# Patient Record
Sex: Male | Born: 1980 | Race: White | Hispanic: No | Marital: Single | Smoking: Never smoker
Health system: Southern US, Community
[De-identification: ages and names within clinical notes are randomized; demographics above are authoritative.]

## PROBLEM LIST (undated history)

## (undated) HISTORY — PX: OTHER SURGICAL HISTORY: SHX169

## (undated) HISTORY — PX: WISDOM TOOTH EXTRACTION: SHX21

---

## 2015-09-20 ENCOUNTER — Ambulatory Visit (HOSPITAL_COMMUNITY)
Admission: EM | Admit: 2015-09-20 | Discharge: 2015-09-20 | Disposition: A | Discharge status: Home / Self Care | Payer: PRIVATE HEALTH INSURANCE | Attending: Family Medicine | Admitting: Family Medicine

## 2015-09-20 ENCOUNTER — Encounter (HOSPITAL_COMMUNITY): Payer: Self-pay | Admitting: Family Medicine

## 2015-09-20 DIAGNOSIS — M25511 Pain in right shoulder: Secondary | ICD-10-CM

## 2015-09-20 DIAGNOSIS — M792 Neuralgia and neuritis, unspecified: Secondary | ICD-10-CM

## 2015-09-20 MED ORDER — GABAPENTIN 300 MG PO CAPS: ORAL_CAPSULE | ORAL | Status: DC

## 2015-09-20 MED ORDER — PREDNISONE 20 MG PO TABS: ORAL_TABLET | ORAL | Status: DC

## 2015-09-20 MED ORDER — DEXAMETHASONE SODIUM PHOSPHATE 10 MG/ML IJ SOLN
INTRAMUSCULAR | Status: AC
  Filled 2015-09-20: qty 1

## 2015-09-20 MED ORDER — HYDROCODONE-ACETAMINOPHEN 5-325 MG PO TABS
1.0000 | ORAL_TABLET | Freq: Once | ORAL | Status: AC
  Administered 2015-09-20: 1 via ORAL

## 2015-09-20 MED ORDER — HYDROCODONE-ACETAMINOPHEN 5-325 MG PO TABS
ORAL_TABLET | ORAL | Status: AC
  Filled 2015-09-20: qty 1

## 2015-09-20 MED ORDER — CYCLOBENZAPRINE HCL 5 MG PO TABS: 5.0000 mg | ORAL_TABLET | Freq: Three times a day (TID) | ORAL | Status: DC | PRN

## 2015-09-20 MED ORDER — TRAMADOL HCL 50 MG PO TABS: 50.0000 mg | ORAL_TABLET | Freq: Four times a day (QID) | ORAL | Status: DC | PRN

## 2015-09-20 MED ORDER — KETOROLAC TROMETHAMINE 60 MG/2ML IM SOLN
60.0000 mg | Freq: Once | INTRAMUSCULAR | Status: AC
  Administered 2015-09-20: 60 mg via INTRAMUSCULAR

## 2015-09-20 MED ORDER — DEXAMETHASONE SODIUM PHOSPHATE 10 MG/ML IJ SOLN
10.0000 mg | Freq: Once | INTRAMUSCULAR | Status: AC
  Administered 2015-09-20: 10 mg via INTRAMUSCULAR

## 2015-09-20 MED ORDER — KETOROLAC TROMETHAMINE 60 MG/2ML IM SOLN
INTRAMUSCULAR | Status: AC
  Filled 2015-09-20: qty 2

## 2015-09-20 NOTE — ED Provider Notes (Signed)
CSN: [id number]     Arrival date & time 09/20/15  1217 History   First MD Initiated Contact with Patient 09/20/15 1240     Chief Complaint  Patient presents with  . Shoulder Pain   (Consider location/radiation/quality/duration/timing/severity/associated sxs/prior Treatment) HPI Comments: 35 year old male presenting to the urgent care complaining of severe, sharp shooting sharp pains to the right shoulder. Patient puts his hand to the top and back of the right shoulder over the trapezius and. Scapular musculature. He is having jumping spasms in which his entire torso tends to jump and jerk frequently. He states that he woke up like this about 3 days ago. He has no history of injury to the shoulder or musculature. He has not been doing any overhead work. He does give a history of remote condition to his neck but cannot recall any specifics. He states the pain tends to radiate from the neck into the muscles of the shoulder and then down the arm. He describes it as a lightning type pain.   History reviewed. No pertinent past medical history. History reviewed. No pertinent past surgical history. History reviewed. No pertinent family history. Social History  Substance Use Topics  . Smoking status: Never Smoker   . Smokeless tobacco: None  . Alcohol Use: Yes    Review of Systems  Constitutional: Positive for activity change. Negative for fever and fatigue.  HENT: Negative.   Respiratory: Negative.   Cardiovascular: Negative for chest pain.  Gastrointestinal: Negative.   Musculoskeletal: Positive for myalgias. Negative for neck stiffness.  Skin: Negative.   Neurological: Negative for dizziness, tremors, seizures, syncope, facial asymmetry, speech difficulty and headaches.  All other systems reviewed and are negative.   Allergies  Review of patient's allergies indicates no known allergies.  Home Medications   Prior to Admission medications   Medication Sig Start Date End Date Taking?  Authorizing Provider  cyclobenzaprine (FLEXERIL) 5 MG tablet Take 1 tablet (5 mg total) by mouth 3 (three) times daily as needed for muscle spasms. 09/20/15   Hayden Rasmussen, NP  gabapentin (NEURONTIN) 300 MG capsule Take 1 tablet at night time for 2 days. Then take 1 twice a day for 1 day then one 3 times a day. 09/20/15   Hayden Rasmussen, NP  predniSONE (DELTASONE) 20 MG tablet Take 3 tabs po on first day, 2 tabs second day, 2 tabs third day, 1 tab fourth day, 1 tab 5th day. Take with food. 09/20/15   Hayden Rasmussen, NP  traMADol (ULTRAM) 50 MG tablet Take 1 tablet (50 mg total) by mouth every 6 (six) hours as needed. 09/20/15   Hayden Rasmussen, NP   Meds Ordered and Administered this Visit   Medications  ketorolac (TORADOL) injection 60 mg (60 mg Intramuscular Given 09/20/15 1303)  HYDROcodone-acetaminophen (NORCO/VICODIN) 5-325 MG per tablet 1 tablet (1 tablet Oral Given 09/20/15 1303)  dexamethasone (DECADRON) injection 10 mg (10 mg Intramuscular Given 09/20/15 1303)    BP 148/88 mmHg  Pulse 85  Temp(Src) 98.4 F (36.9 C) (Oral)  Resp 18  SpO2 99% No data found.   Physical Exam  Constitutional: He is oriented to person, place, and time. He appears well-developed and well-nourished. No distress.  Eyes: EOM are normal. Pupils are equal, round, and reactive to light.  Neck: Normal range of motion. Neck supple.  Exhibits full range of motion of the neck. No cervical tenderness or cervical spine tenderness.  Cardiovascular: Normal rate.   Pulmonary/Chest: Effort normal.  Musculoskeletal: Normal range of motion.  He exhibits tenderness. He exhibits no edema.  Minor tenderness to the right trapezius and periscapular musculature. Minor tenderness to deep palpation of the deltoid. Continued palpation of these muscles did not reveal spasms associated with his jerking motions. These jerking motions do not involve the right upper extremity or shoulder but the entire upper torso. Distal neurovascular and motor sensory  is intact. Circulation intact. 2+ radial pulses. Skin is warm and moist as he is mildly diaphoretic. Color is normal. Demonstrates full range of motion of the shoulder joint and right upper extremity.  Neurological: He is alert and oriented to person, place, and time. No cranial nerve deficit. He exhibits normal muscle tone. Coordination normal.  Skin: Skin is warm and dry. No rash noted.  Nursing note and vitals reviewed.   ED Course  Procedures (including critical care time)  Labs Review Labs Reviewed - No data to display  Imaging Review No results found.   Visual Acuity Review  Right Eye Distance:   Left Eye Distance:   Bilateral Distance:    Right Eye Near:   Left Eye Near:    Bilateral Near:         MDM   1. Neuralgia   2. Shoulder pain, acute, right    Meds ordered this encounter  Medications  . ketorolac (TORADOL) injection 60 mg    Sig:   . HYDROcodone-acetaminophen (NORCO/VICODIN) 5-325 MG per tablet 1 tablet    Sig:   . dexamethasone (DECADRON) injection 10 mg    Sig:   . gabapentin (NEURONTIN) 300 MG capsule    Sig: Take 1 tablet at night time for 2 days. Then take 1 twice a day for 1 day then one 3 times a day.    Dispense:  30 capsule    Refill:  0    Order Specific Question:  Supervising Provider    Answer:  Linna HoffKINDL, JAMES D [phone][[id number]]  . cyclobenzaprine (FLEXERIL) 5 MG tablet    Sig: Take 1 tablet (5 mg total) by mouth 3 (three) times daily as needed for muscle spasms.    Dispense:  20 tablet    Refill:  0    Order Specific Question:  Supervising Provider    Answer:  Linna HoffKINDL, JAMES D [phone][[id number]]  . predniSONE (DELTASONE) 20 MG tablet    Sig: Take 3 tabs po on first day, 2 tabs second day, 2 tabs third day, 1 tab fourth day, 1 tab 5th day. Take with food.    Dispense:  9 tablet    Refill:  0    Order Specific Question:  Supervising Provider    Answer:  Linna HoffKINDL, JAMES D [phone][[id number]]  . traMADol (ULTRAM) 50 MG tablet    Sig: Take 1 tablet (50 mg total) by mouth  every 6 (six) hours as needed.    Dispense:  15 tablet    Refill:  0    Order Specific Question:  Supervising Provider    Answer:  Linna HoffKINDL, JAMES D [phone][[id number]]   After the initial medication administration here the patient states he is feeling much better. The amount of pain has decreased in the frequency of these shocky, lancinating pain episodes have decreased. He states he feels better and is ready to home. He will be discharged on the above medications. He is aware that these may cause drowsiness. He is also referred to a neurologist.    Hayden Rasmussenavid Galileah Piggee, NP 09/20/15 1335

## 2015-09-20 NOTE — ED Notes (Signed)
Pt here for severe riht shoulder pain for 3 days. sts unable to sleep. Describes the pain as sharp, stabbing and spasm. Pt unable to speak in full sentences due to the pain. Denise injury. sts has been using everything OTC without help.

## 2015-09-20 NOTE — Discharge Instructions (Signed)
Neuropathic Pain Neuropathic pain is pain caused by damage to the nerves that are responsible for certain sensations in your body (sensory nerves). The pain can be caused by damage to:   The sensory nerves that send signals to your spinal cord and brain (peripheral nervous system).  The sensory nerves in your brain or spinal cord (central nervous system). Neuropathic pain can make you more sensitive to pain. What would be a minor sensation for most people may feel very painful if you have neuropathic pain. This is usually a long-term condition that can be difficult to treat. The type of pain can differ from person to person. It may start suddenly (acute), or it may develop slowly and last for a long time (chronic). Neuropathic pain may come and go as damaged nerves heal or may stay at the same level for years. It often causes emotional distress, loss of sleep, and a lower quality of life. CAUSES  The most common cause of damage to a sensory nerve is diabetes. Many other diseases and conditions can also cause neuropathic pain. Causes of neuropathic pain can be classified as:  Toxic. Many drugs and chemicals can cause toxic damage. The most common cause of toxic neuropathic pain is damage from drug treatment for cancer (chemotherapy).  Metabolic. This type of pain can happen when a disease causes imbalances that damage nerves. Diabetes is the most common of these diseases. Vitamin B deficiency caused by long-term alcohol abuse is another common cause.  Traumatic. Any injury that cuts, crushes, or stretches a nerve can cause damage and pain. A common example is feeling pain after losing an arm or leg (phantom limb pain).  Compression-related. If a sensory nerve gets trapped or compressed for a long period of time, the blood supply to the nerve can be cut off.  Vascular. Many blood vessel diseases can cause neuropathic pain by decreasing blood supply and oxygen to nerves.  Autoimmune. This type of  pain results from diseases in which the body's defense system mistakenly attacks sensory nerves. Examples of autoimmune diseases that can cause neuropathic pain include lupus and multiple sclerosis.  Infectious. Many types of viral infections can damage sensory nerves and cause pain. Shingles infection is a common cause of this type of pain.  Inherited. Neuropathic pain can be a symptom of many diseases that are passed down through families (genetic). SIGNS AND SYMPTOMS  The main symptom is pain. Neuropathic pain is often described as:  Burning.  Shock-like.  Stinging.  Hot or cold.  Itching. DIAGNOSIS  No single test can diagnose neuropathic pain. Your health care provider will do a physical exam and ask you about your pain. You may use a pain scale to describe how bad your pain is. You may also have tests to see if you have a high sensitivity to pain and to help find the cause and location of any sensory nerve damage. These tests may include:  Imaging studies, such as:  X-rays.  CT scan.  MRI.  Nerve conduction studies to test how well nerve signals travel through your sensory nerves (electrodiagnostic testing).  Stimulating your sensory nerves through electrodes on your skin and measuring the response in your spinal cord and brain (somatosensory evoked potentials). TREATMENT  Treatment for neuropathic pain may change over time. You may need to try different treatment options or a combination of treatments. Some options include:  Over-the-counter pain relievers.  Prescription medicines. Some medicines used to treat other conditions may also help neuropathic pain. These  include medicines to:  Control seizures (anticonvulsants).  Relieve depression (antidepressants).  Prescription-strength pain relievers (narcotics). These are usually used when other pain relievers do not help.  Transcutaneous nerve stimulation (TENS). This uses electrical currents to block painful nerve  signals. The treatment is painless.  Topical and local anesthetics. These are medicines that numb the nerves. They can be injected as a nerve block or applied to the skin.  Alternative treatments, such as:  Acupuncture.  Meditation.  Massage.  Physical therapy.  Pain management programs.  Counseling. HOME CARE INSTRUCTIONS  Learn as much as you can about your condition.  Take medicines only as directed by your health care provider.  Work closely with all your health care providers to find what works best for you.  Have a good support system at home.  Consider joining a chronic pain support group. SEEK MEDICAL CARE IF:  Your pain treatments are not helping.  You are having side effects from your medicines.  You are struggling with fatigue, mood changes, depression, or anxiety.   This information is not intended to replace advice given to you by your health care provider. Make sure you discuss any questions you have with your health care provider.   Document Released: 11/24/2003 Document Revised: 03/19/2014 Document Reviewed: 08/06/2013 Elsevier Interactive Patient Education 2016 Else[employer] Inc.  Shoulder Pain The shoulder is the joint that connects your arms to your body. The bones that form the shoulder joint include the upper arm bone (humerus), the shoulder blade (scapula), and the collarbone (clavicle). The top of the humerus is shaped like a ball and fits into a rather flat socket on the scapula (glenoid cavity). A combination of muscles and strong, fibrous tissues that connect muscles to bones (tendons) support your shoulder joint and hold the ball in the socket. Small, fluid-filled sacs (bursae) are located in different areas of the joint. They act as cushions between the bones and the overlying soft tissues and help reduce friction between the gliding tendons and the bone as you move your arm. Your shoulder joint allows a wide range of motion in your arm. This range of  motion allows you to do things like scratch your back or throw a ball. However, this range of motion also makes your shoulder more prone to pain from overuse and injury. Causes of shoulder pain can originate from both injury and overuse and usually can be grouped in the following four categories:  Redness, swelling, and pain (inflammation) of the tendon (tendinitis) or the bursae (bursitis).  Instability, such as a dislocation of the joint.  Inflammation of the joint (arthritis).  Broken bone (fracture). HOME CARE INSTRUCTIONS   Apply ice to the sore area.  Put ice in a plastic bag.  Place a towel between your skin and the bag.  Leave the ice on for 15-20 minutes, 3-4 times per day for the first 2 days, or as directed by your health care provider.  Stop using cold packs if they do not help with the pain.  If you have a shoulder sling or immobilizer, wear it as long as your caregiver instructs. Only remove it to shower or bathe. Move your arm as little as possible, but keep your hand moving to prevent swelling.  Squeeze a soft ball or foam pad as much as possible to help prevent swelling.  Only take over-the-counter or prescription medicines for pain, discomfort, or fever as directed by your caregiver. SEEK MEDICAL CARE IF:   Your shoulder pain increases, or  new pain develops in your arm, hand, or fingers.  Your hand or fingers become cold and numb.  Your pain is not relieved with medicines. SEEK IMMEDIATE MEDICAL CARE IF:   Your arm, hand, or fingers are numb or tingling.  Your arm, hand, or fingers are significantly swollen or turn white or blue. MAKE SURE YOU:   Understand these instructions.  Will watch your condition.  Will get help right away if you are not doing well or get worse.   This information is not intended to replace advice given to you by your health care provider. Make sure you discuss any questions you have with your health care provider.   Document  Released: 12/06/2004 Document Revised: 03/19/2014 Document Reviewed: 06/21/2014 Elsevier Interactive Patient Education [employer] Elsevier Inc.

## 2015-09-27 ENCOUNTER — Emergency Department (HOSPITAL_COMMUNITY): Payer: Medicaid - Out of State

## 2015-09-27 ENCOUNTER — Observation Stay (HOSPITAL_COMMUNITY)
Admission: EM | Admit: 2015-09-27 | Discharge: 2015-09-29 | Disposition: A | Discharge status: Home / Self Care | Payer: Medicaid - Out of State | Attending: Internal Medicine | Admitting: Internal Medicine

## 2015-09-27 ENCOUNTER — Encounter (HOSPITAL_COMMUNITY): Payer: Self-pay | Admitting: Vascular Surgery

## 2015-09-27 DIAGNOSIS — Z79899 Other long term (current) drug therapy: Secondary | ICD-10-CM | POA: Diagnosis not present

## 2015-09-27 DIAGNOSIS — I340 Nonrheumatic mitral (valve) insufficiency: Secondary | ICD-10-CM | POA: Insufficient documentation

## 2015-09-27 DIAGNOSIS — R7989 Other specified abnormal findings of blood chemistry: Secondary | ICD-10-CM | POA: Diagnosis not present

## 2015-09-27 DIAGNOSIS — Z8249 Family history of ischemic heart disease and other diseases of the circulatory system: Secondary | ICD-10-CM | POA: Diagnosis not present

## 2015-09-27 DIAGNOSIS — R200 Anesthesia of skin: Secondary | ICD-10-CM | POA: Diagnosis not present

## 2015-09-27 DIAGNOSIS — I10 Essential (primary) hypertension: Secondary | ICD-10-CM | POA: Insufficient documentation

## 2015-09-27 DIAGNOSIS — Z823 Family history of stroke: Secondary | ICD-10-CM | POA: Insufficient documentation

## 2015-09-27 DIAGNOSIS — M50121 Cervical disc disorder at C4-C5 level with radiculopathy: Secondary | ICD-10-CM | POA: Diagnosis not present

## 2015-09-27 DIAGNOSIS — F909 Attention-deficit hyperactivity disorder, unspecified type: Secondary | ICD-10-CM | POA: Diagnosis not present

## 2015-09-27 DIAGNOSIS — Z7901 Long term (current) use of anticoagulants: Secondary | ICD-10-CM | POA: Insufficient documentation

## 2015-09-27 DIAGNOSIS — M4802 Spinal stenosis, cervical region: Secondary | ICD-10-CM | POA: Insufficient documentation

## 2015-09-27 DIAGNOSIS — R030 Elevated blood-pressure reading, without diagnosis of hypertension: Secondary | ICD-10-CM

## 2015-09-27 DIAGNOSIS — R0602 Shortness of breath: Secondary | ICD-10-CM | POA: Insufficient documentation

## 2015-09-27 DIAGNOSIS — I2699 Other pulmonary embolism without acute cor pulmonale: Principal | ICD-10-CM | POA: Insufficient documentation

## 2015-09-27 DIAGNOSIS — IMO0001 Reserved for inherently not codable concepts without codable children: Secondary | ICD-10-CM | POA: Diagnosis present

## 2015-09-27 LAB — PROTIME-INR
INR: 0.97 (ref 0.00–1.49)
Prothrombin Time: 13.1 seconds (ref 11.6–15.2)

## 2015-09-27 LAB — BASIC METABOLIC PANEL
ANION GAP: 12 (ref 5–15)
BUN: 12 mg/dL (ref 6–20)
CALCIUM: 9.9 mg/dL (ref 8.9–10.3)
CHLORIDE: 103 mmol/L (ref 101–111)
CO2: 23 mmol/L (ref 22–32)
CREATININE: 1.22 mg/dL (ref 0.61–1.24)
GFR calc Af Amer: >60 mL/min (ref >60)
GFR calc non Af Amer: >60 mL/min (ref >60)
GLUCOSE: 115 mg/dL — AB (ref 65–99)
POTASSIUM: 4.1 mmol/L (ref 3.5–5.1)
Sodium: 138 mmol/L (ref 135–145)

## 2015-09-27 LAB — I-STAT CHEM 8, ED
BUN: 14 mg/dL (ref 6–20)
CALCIUM ION: 1.13 mmol/L (ref 1.13–1.30)
CREATININE: 1.4 mg/dL — AB (ref 0.61–1.24)
Chloride: 103 mmol/L (ref 101–111)
GLUCOSE: 109 mg/dL — AB (ref 65–99)
HEMATOCRIT: 47 % (ref 39.0–52.0)
HEMOGLOBIN: 16 g/dL (ref 13.0–17.0)
Potassium: 4 mmol/L (ref 3.5–5.1)
Sodium: 140 mmol/L (ref 135–145)
TCO2: 25 mmol/L (ref 0–100)

## 2015-09-27 LAB — CBC
HCT: 43.6 % (ref 39.0–52.0)
Hemoglobin: 15.3 g/dL (ref 13.0–17.0)
MCH: 30.8 pg (ref 26.0–34.0)
MCHC: 35.1 g/dL (ref 30.0–36.0)
MCV: 87.9 fL (ref 78.0–100.0)
PLATELETS: 357 10*3/uL (ref 150–400)
RBC: 4.96 MIL/uL (ref 4.22–5.81)
RDW: 12.4 % (ref 11.5–15.5)
WBC: 9.5 10*3/uL (ref 4.0–10.5)

## 2015-09-27 LAB — I-STAT TROPONIN, ED: TROPONIN I, POC: 0.01 ng/mL (ref 0.00–0.08)

## 2015-09-27 LAB — APTT: aPTT: 31 seconds (ref 24–37)

## 2015-09-27 MED ORDER — MORPHINE SULFATE (PF) 4 MG/ML IV SOLN
4.0000 mg | Freq: Once | INTRAVENOUS | Status: AC
  Administered 2015-09-27: 4 mg via INTRAVENOUS

## 2015-09-27 MED ORDER — MORPHINE SULFATE (PF) 4 MG/ML IV SOLN
INTRAVENOUS | Status: AC
  Filled 2015-09-27: qty 1

## 2015-09-27 MED ORDER — MORPHINE SULFATE (PF) 4 MG/ML IV SOLN
4.0000 mg | Freq: Once | INTRAVENOUS | Status: AC
  Administered 2015-09-27: 4 mg via INTRAVENOUS
  Filled 2015-09-27: qty 1

## 2015-09-27 MED ORDER — METOPROLOL TARTRATE 5 MG/5ML IV SOLN
5.0000 mg | Freq: Once | INTRAVENOUS | Status: AC
  Administered 2015-09-27: 5 mg via INTRAVENOUS
  Filled 2015-09-27: qty 5

## 2015-09-27 MED ORDER — HEPARIN (PORCINE) IN NACL 100-0.45 UNIT/ML-% IJ SOLN
1800.0000 [IU]/h | INTRAMUSCULAR | Status: DC
  Administered 2015-09-27 – 2015-09-28 (×2): 1600 [IU]/h via INTRAVENOUS
  Administered 2015-09-28: 1800 [IU]/h via INTRAVENOUS
  Filled 2015-09-27 (×2): qty 250

## 2015-09-27 MED ORDER — NITROGLYCERIN 0.4 MG SL SUBL: 0.4000 mg | SUBLINGUAL_TABLET | SUBLINGUAL | Status: DC | PRN

## 2015-09-27 MED ORDER — HEPARIN BOLUS VIA INFUSION
6000.0000 [IU] | Freq: Once | INTRAVENOUS | Status: AC
  Administered 2015-09-27: 6000 [IU] via INTRAVENOUS
  Filled 2015-09-27: qty 6000

## 2015-09-27 MED ORDER — ASPIRIN 81 MG PO CHEW
324.0000 mg | CHEWABLE_TABLET | Freq: Once | ORAL | Status: AC
  Administered 2015-09-27: 324 mg via ORAL
  Filled 2015-09-27: qty 4

## 2015-09-27 MED ORDER — NITROGLYCERIN 2 % TD OINT
1.0000 [in_us] | TOPICAL_OINTMENT | Freq: Once | TRANSDERMAL | Status: AC
  Administered 2015-09-27: 1 [in_us] via TOPICAL
  Filled 2015-09-27: qty 1

## 2015-09-27 MED ORDER — IOPAMIDOL (ISOVUE-370) INJECTION 76%
INTRAVENOUS | Status: AC
  Administered 2015-09-27: 100 mL via INTRAVENOUS
  Filled 2015-09-27: qty 100

## 2015-09-27 NOTE — ED Provider Notes (Signed)
Screening evaluation initiated by me at 2145.  Patient presented to the emergency room with complaints of chest pain and shortness of breath. Started a few hours ago. Pain is very sharp and severe.  In the past week he has noticed some pain in his mid and upper back. He intermittently had some numbness in his right arm. He was scheduled to see an orthopedic doctor. Physical Exam  BP 142/108 mmHg  Pulse 142  Temp(Src) 98.1 F (36.7 C) (Oral)  Resp 26  Ht 6\' 4"  (1.93 m)  Wt 104.327 kg  BMI 28.01 kg/m2  SpO2 97%  Physical Exam  Constitutional: He appears well-developed and well-nourished. He appears distressed.  HENT:  Head: Normocephalic and atraumatic.  Right Ear: External ear normal.  Left Ear: External ear normal.  Eyes: Conjunctivae are normal. Right eye exhibits no discharge. Left eye exhibits no discharge. No scleral icterus.  Neck: Neck supple. No tracheal deviation present.  Cardiovascular: Tachycardia present.   Pulmonary/Chest: Effort normal. No stridor. Tachypnea noted. No respiratory distress.  Musculoskeletal: He exhibits no edema.  Neurological: He is alert. Cranial nerve deficit: no gross deficits.  Skin: Skin is warm. No rash noted. He is diaphoretic.  Psychiatric: He has a normal mood and affect.  Nursing note and vitals reviewed.   ED Course  Procedures  EKG Interpretation  Date/Time:  Tuesday September 27 2015 21:33:06 EDT Ventricular Rate:  149 PR Interval:  112 QRS Duration: 88 QT Interval:  270 QTC Calculation: 425 R Axis:   52 Text Interpretation:  Sinus tachycardia Cannot rule out Anterior infarct , age undetermined Abnormal ECG No previous tracing Confirmed by Florina Glas  MD-J, Donna Snooks ([id number]) on 09/27/2015 9:55:55 PM      Medications given in the ED Medications  aspirin chewable tablet 324 mg (not administered)  nitroGLYCERIN (NITROSTAT) SL tablet 0.4 mg (not administered)  nitroGLYCERIN (NITROGLYN) 2 % ointment 1 inch (not administered)  morphine 4 MG/ML  injection 4 mg (0 mg Intravenous Hold 09/27/15 2154)  metoprolol (LOPRESSOR) injection 5 mg (not administered)  morphine 4 MG/ML injection (not administered)  morphine 4 MG/ML injection 4 mg (4 mg Intravenous Given 09/27/15 2154)     MDM Initial screening EKG does not suggest an ST elevation MI. Patient's tachycardia and severe pain is concerning for the possibility of aortic dissection. I've ordered a CT angiogram of the chest.  Care will be turned over to Dr Judd Lienelo     Troy DibblesJon Jhovanny Guinta, MD 09/27/15 2157

## 2015-09-27 NOTE — ED Provider Notes (Signed)
CSN: [id number]     Arrival date & time 09/27/15  2124 History   First MD Initiated Contact with Patient 09/27/15 2208     Chief Complaint  Patient presents with  . Chest Pain  . Back Pain     (Consider location/radiation/quality/duration/timing/severity/associated sxs/prior Treatment) HPI Comments: Patient is a 35 year old male with no significant past medical history. He presents for complaints of chest and back pain. He reports he's been experiencing pain in his back between his shoulder blades for the past 2 weeks. He has been having difficulty sleeping secondary to this. This evening he began experiencing shortness of breath and pains radiating through to his chest. He became extremely sweaty and diaphoretic and presents here for evaluation.  Patient is a 35 y.o. male presenting with chest pain and back pain. The history is provided by the patient.  Chest Pain Pain location:  Substernal area Pain quality: tightness   Pain radiates to:  Upper back Pain radiates to the back: yes   Pain severity:  Severe Onset quality:  Sudden Duration:  2 hours Timing:  Constant Progression:  Worsening Chronicity:  New Associated symptoms: back pain   Back Pain Associated symptoms: chest pain     History reviewed. No pertinent past medical history. Past Surgical History  Procedure Laterality Date  . Wisdom tooth extraction     No family history on file. Social History  Substance Use Topics  . Smoking status: Never Smoker   . Smokeless tobacco: None  . Alcohol Use: Yes    Review of Systems  Cardiovascular: Positive for chest pain.  Musculoskeletal: Positive for back pain.  All other systems reviewed and are negative.     Allergies  Review of patient's allergies indicates no known allergies.  Home Medications   Prior to Admission medications   Medication Sig Start Date End Date Taking? Authorizing Provider  cyclobenzaprine (FLEXERIL) 5 MG tablet Take 1 tablet (5 mg total) by  mouth 3 (three) times daily as needed for muscle spasms. 09/20/15   Hayden Rasmussenavid Mabe, NP  gabapentin (NEURONTIN) 300 MG capsule Take 1 tablet at night time for 2 days. Then take 1 twice a day for 1 day then one 3 times a day. 09/20/15   Hayden Rasmussenavid Mabe, NP  predniSONE (DELTASONE) 20 MG tablet Take 3 tabs po on first day, 2 tabs second day, 2 tabs third day, 1 tab fourth day, 1 tab 5th day. Take with food. 09/20/15   Hayden Rasmussenavid Mabe, NP  traMADol (ULTRAM) 50 MG tablet Take 1 tablet (50 mg total) by mouth every 6 (six) hours as needed. 09/20/15   Hayden Rasmussenavid Mabe, NP   BP 142/108 mmHg  Pulse 142  Temp(Src) 98.1 F (36.7 C) (Oral)  Resp 26  Ht 6\' 4"  (1.93 m)  Wt 230 lb (104.327 kg)  BMI 28.01 kg/m2  SpO2 97% Physical Exam  Constitutional: He is oriented to person, place, and time. He appears well-developed and well-nourished. He appears distressed.  HENT:  Head: Normocephalic and atraumatic.  Mouth/Throat: Oropharynx is clear and moist.  Neck: Normal range of motion. Neck supple.  Cardiovascular: Normal rate and regular rhythm.  Exam reveals no friction rub.   No murmur heard. Pulmonary/Chest: Effort normal and breath sounds normal. No respiratory distress. He has no wheezes. He has no rales. He exhibits no tenderness.  Abdominal: Soft. Bowel sounds are normal. He exhibits no distension. There is no tenderness.  Musculoskeletal: Normal range of motion. He exhibits no edema.  Neurological: He is alert  and oriented to person, place, and time. Coordination normal.  Skin: Skin is warm. He is diaphoretic.  Nursing note and vitals reviewed.   ED Course  Procedures (including critical care time) Labs Review Labs Reviewed  I-STAT CHEM 8, ED - Abnormal; Notable for the following:    Creatinine, Ser 1.40 (*)    Glucose, Bld 109 (*)    All other components within normal limits  CBC  APTT  PROTIME-INR  BASIC METABOLIC PANEL  URINE RAPID DRUG SCREEN, HOSP PERFORMED  I-STAT TROPOININ, ED  Rosezena Sensor, ED     Imaging Review Dg Chest Portable 1 View  09/27/2015  CLINICAL DATA:  Shortness of breath and chest pain EXAM: PORTABLE CHEST 1 VIEW COMPARISON:  None. FINDINGS: Lungs are clear. Heart size and pulmonary vascularity are normal. No adenopathy. No pneumothorax. No bone lesions. IMPRESSION: No edema or consolidation. Electronically Signed   By: Bretta Bang III M.D.   On: 09/27/2015 22:03   I have personally reviewed and evaluated these images and lab results as part of my medical decision-making.   EKG Interpretation   Date/Time:  Tuesday September 27 2015 21:33:06 EDT Ventricular Rate:  149 PR Interval:  112 QRS Duration: 88 QT Interval:  270 QTC Calculation: 425 R Axis:   52 Text Interpretation:  Sinus tachycardia Cannot rule out Anterior infarct ,  age undetermined Abnormal ECG No previous tracing Confirmed by KNAPP   MD-J, JON ([id number]) on 09/27/2015 9:55:55 PM      MDM   Final diagnoses:  None    Workup reveals a right-sided pulmonary embolism. He will be admitted to the hospitalist service under the care of Dr. Toniann Fail.    Geoffery Lyons, MD 09/27/15 [phone]

## 2015-09-27 NOTE — Progress Notes (Signed)
ANTICOAGULATION CONSULT NOTE - Initial Consult  Pharmacy Consult for Heparin  Indication: pulmonary embolus, new onset  No Known Allergies  Patient Measurements: Height: 6\' 4"  (193 cm) Weight: 230 lb (104.327 kg) IBW/kg (Calculated) : 86.8  Vital Signs: Temp: 98.1 F (36.7 C) (07/18 2131) Temp Source: Oral (07/18 2131) BP: 142/92 mmHg (07/18 2245) Pulse Rate: 90 (07/18 2245)  Labs:  Recent Labs  09/27/15 2137 09/27/15 2153 09/27/15 2204  HGB 15.3  --  16.0  HCT 43.6  --  47.0  PLT 357  --   --   APTT  --  31  --   LABPROT  --  13.1  --   INR  --  0.97  --   CREATININE 1.22  --  1.40*    Estimated Creatinine Clearance: 97.7 mL/min (by C-G formula based on Cr of 1.4).   Assessment: 35 y/o M with new onset PE per CT Angio, CBC good, renal function ok, baseline INR is normal, PTA meds reviewed  Goal of Therapy:  Heparin level 0.3-0.7 units/ml Monitor platelets by anticoagulation protocol: Yes   Plan:  -Heparin 6000 units BOLUS -Start heparin drip at 1600 units/hr -0700 HL -Daily CBC/HL -Monitor for bleeding  Abran DukeLedford, Drenda Sobecki 09/27/2015,11:14 PM

## 2015-09-27 NOTE — ED Notes (Signed)
Pt reports to the ED for eval of CP, SOB, and diaphoresis. These symptoms began a few hours ago. He has been having severe mid/upper back pain x 1 week. Was seen for this and given medication and he supposed to see an orthopedic specialist but has not been able to get in yet. Pt A&Ox4. He is tachycardic and tachypnic.

## 2015-09-28 ENCOUNTER — Observation Stay (HOSPITAL_BASED_OUTPATIENT_CLINIC_OR_DEPARTMENT_OTHER): Payer: Medicaid - Out of State

## 2015-09-28 ENCOUNTER — Observation Stay (HOSPITAL_COMMUNITY): Payer: Medicaid - Out of State

## 2015-09-28 ENCOUNTER — Encounter (HOSPITAL_COMMUNITY): Payer: Self-pay | Admitting: Internal Medicine

## 2015-09-28 DIAGNOSIS — R030 Elevated blood-pressure reading, without diagnosis of hypertension: Secondary | ICD-10-CM

## 2015-09-28 DIAGNOSIS — I2699 Other pulmonary embolism without acute cor pulmonale: Secondary | ICD-10-CM

## 2015-09-28 DIAGNOSIS — R202 Paresthesia of skin: Secondary | ICD-10-CM | POA: Diagnosis not present

## 2015-09-28 DIAGNOSIS — R200 Anesthesia of skin: Secondary | ICD-10-CM | POA: Diagnosis present

## 2015-09-28 DIAGNOSIS — IMO0001 Reserved for inherently not codable concepts without codable children: Secondary | ICD-10-CM | POA: Diagnosis present

## 2015-09-28 LAB — ECHOCARDIOGRAM COMPLETE
CHL CUP STROKE VOLUME: 58 mL
E decel time: 169 msec
E/e' ratio: 6.9
FS: 26 % — AB (ref 28–44)
Height: 75.984 in
IVS/LV PW RATIO, ED: 1.1
LA ID, A-P, ES: 33 mm
LA vol index: 29.8 mL/m2
LA vol: 70.1 mL
LADIAMINDEX: 1.4 cm/m2
LAVOLA4C: 54.4 mL
LEFT ATRIUM END SYS DIAM: 33 mm
LV E/e'average: 6.9
LV PW d: 11.5 mm — AB (ref 0.6–1.1)
LV TDI E'LATERAL: 10.9
LV TDI E'MEDIAL: 9.79
LV dias vol index: 47 mL/m2
LV sys vol index: 23 mL/m2
LV sys vol: 54 mL (ref 21–61)
LVDIAVOL: 111 mL (ref 62–150)
LVEEMED: 6.9
LVELAT: 10.9 cm/s
LVOT area: 5.73 cm2
LVOT diameter: 27 mm
MV Dec: 169
MVPG: 2 mmHg
MVPKAVEL: 49.5 m/s
MVPKEVEL: 75.2 m/s
Simpson's disk: 52
TAPSE: 27 mm
Weight: 3680 oz

## 2015-09-28 LAB — COMPREHENSIVE METABOLIC PANEL
ALBUMIN: 3.9 g/dL (ref 3.5–5.0)
ALT: 19 U/L (ref 17–63)
ANION GAP: 10 (ref 5–15)
AST: 19 U/L (ref 15–41)
Alkaline Phosphatase: 47 U/L (ref 38–126)
BUN: 12 mg/dL (ref 6–20)
CALCIUM: 8.6 mg/dL — AB (ref 8.9–10.3)
CHLORIDE: 104 mmol/L (ref 101–111)
CO2: 24 mmol/L (ref 22–32)
Creatinine, Ser: 1.32 mg/dL — ABNORMAL HIGH (ref 0.61–1.24)
GFR calc non Af Amer: >60 mL/min (ref >60)
GLUCOSE: 111 mg/dL — AB (ref 65–99)
POTASSIUM: 4 mmol/L (ref 3.5–5.1)
SODIUM: 138 mmol/L (ref 135–145)
Total Bilirubin: 0.5 mg/dL (ref 0.3–1.2)
Total Protein: 6 g/dL — ABNORMAL LOW (ref 6.5–8.1)

## 2015-09-28 LAB — CBC
HCT: 39.7 % (ref 39.0–52.0)
HEMOGLOBIN: 12.9 g/dL — AB (ref 13.0–17.0)
MCH: 29.1 pg (ref 26.0–34.0)
MCHC: 32.5 g/dL (ref 30.0–36.0)
MCV: 89.6 fL (ref 78.0–100.0)
PLATELETS: 305 10*3/uL (ref 150–400)
RBC: 4.43 MIL/uL (ref 4.22–5.81)
RDW: 12.4 % (ref 11.5–15.5)
WBC: 7.4 10*3/uL (ref 4.0–10.5)

## 2015-09-28 LAB — RAPID URINE DRUG SCREEN, HOSP PERFORMED
Amphetamines: POSITIVE — AB
Barbiturates: NOT DETECTED
Benzodiazepines: NOT DETECTED
COCAINE: NOT DETECTED
OPIATES: POSITIVE — AB
Tetrahydrocannabinol: POSITIVE — AB

## 2015-09-28 LAB — HEPARIN LEVEL (UNFRACTIONATED): Heparin Unfractionated: 0.26 IU/mL — ABNORMAL LOW (ref 0.30–0.70)

## 2015-09-28 LAB — TROPONIN I
Troponin I: <0.03 ng/mL (ref <0.03)
Troponin I: <0.03 ng/mL (ref <0.03)

## 2015-09-28 LAB — BRAIN NATRIURETIC PEPTIDE: B NATRIURETIC PEPTIDE 5: 6.5 pg/mL (ref 0.0–100.0)

## 2015-09-28 LAB — MRSA PCR SCREENING: MRSA by PCR: NEGATIVE

## 2015-09-28 MED ORDER — ONDANSETRON HCL 4 MG PO TABS: 4.0000 mg | ORAL_TABLET | Freq: Four times a day (QID) | ORAL | Status: DC | PRN

## 2015-09-28 MED ORDER — ACETAMINOPHEN 325 MG PO TABS
650.0000 mg | ORAL_TABLET | Freq: Four times a day (QID) | ORAL | Status: DC | PRN
  Administered 2015-09-28: 650 mg via ORAL
  Filled 2015-09-28: qty 2

## 2015-09-28 MED ORDER — MORPHINE SULFATE (PF) 4 MG/ML IV SOLN
4.0000 mg | Freq: Once | INTRAVENOUS | Status: AC
  Administered 2015-09-28: 4 mg via INTRAVENOUS
  Filled 2015-09-28: qty 1

## 2015-09-28 MED ORDER — HEPARIN (PORCINE) IN NACL 100-0.45 UNIT/ML-% IJ SOLN: 1800.0000 [IU]/h | INTRAMUSCULAR | Status: AC

## 2015-09-28 MED ORDER — AMPHETAMINE-DEXTROAMPHETAMINE 10 MG PO TABS
20.0000 mg | ORAL_TABLET | Freq: Three times a day (TID) | ORAL | Status: DC
  Administered 2015-09-28 – 2015-09-29 (×5): 20 mg via ORAL
  Filled 2015-09-28 (×5): qty 2

## 2015-09-28 MED ORDER — ONDANSETRON HCL 4 MG/2ML IJ SOLN: 4.0000 mg | Freq: Four times a day (QID) | INTRAMUSCULAR | Status: DC | PRN

## 2015-09-28 MED ORDER — GABAPENTIN 400 MG PO CAPS
800.0000 mg | ORAL_CAPSULE | Freq: Three times a day (TID) | ORAL | Status: DC
  Administered 2015-09-28 – 2015-09-29 (×3): 800 mg via ORAL
  Filled 2015-09-28 (×2): qty 2

## 2015-09-28 MED ORDER — HEPARIN BOLUS VIA INFUSION
2000.0000 [IU] | Freq: Once | INTRAVENOUS | Status: AC
  Administered 2015-09-28: 2000 [IU] via INTRAVENOUS
  Filled 2015-09-28: qty 2000

## 2015-09-28 MED ORDER — ACETAMINOPHEN 650 MG RE SUPP: 650.0000 mg | Freq: Four times a day (QID) | RECTAL | Status: DC | PRN

## 2015-09-28 MED ORDER — RIVAROXABAN 15 MG PO TABS: 15.0000 mg | ORAL_TABLET | Freq: Two times a day (BID) | ORAL | Status: DC

## 2015-09-28 MED ORDER — MORPHINE SULFATE (PF) 2 MG/ML IV SOLN
1.0000 mg | INTRAVENOUS | Status: DC | PRN
  Administered 2015-09-28 (×2): 1 mg via INTRAVENOUS
  Filled 2015-09-28 (×2): qty 1

## 2015-09-28 MED ORDER — SODIUM CHLORIDE 0.9 % IV SOLN
INTRAVENOUS | Status: AC
  Administered 2015-09-28 (×2): via INTRAVENOUS

## 2015-09-28 MED ORDER — RIVAROXABAN 15 MG PO TABS
15.0000 mg | ORAL_TABLET | Freq: Two times a day (BID) | ORAL | Status: DC
  Administered 2015-09-28 – 2015-09-29 (×2): 15 mg via ORAL
  Filled 2015-09-28 (×2): qty 1

## 2015-09-28 MED ORDER — HYDROMORPHONE HCL 1 MG/ML IJ SOLN
1.0000 mg | INTRAMUSCULAR | Status: DC | PRN
  Administered 2015-09-28 (×2): 1 mg via INTRAVENOUS
  Filled 2015-09-28 (×2): qty 1

## 2015-09-28 MED ORDER — RIVAROXABAN 20 MG PO TABS: 20.0000 mg | ORAL_TABLET | Freq: Every day | ORAL | Status: DC

## 2015-09-28 MED ORDER — OXYCODONE-ACETAMINOPHEN 5-325 MG PO TABS
1.0000 | ORAL_TABLET | ORAL | Status: DC | PRN
  Administered 2015-09-28: 2 via ORAL
  Administered 2015-09-28: 1 via ORAL
  Administered 2015-09-28 – 2015-09-29 (×2): 2 via ORAL
  Filled 2015-09-28: qty 1
  Filled 2015-09-28: qty 2
  Filled 2015-09-28: qty 1
  Filled 2015-09-28: qty 2
  Filled 2015-09-28: qty 1

## 2015-09-28 MED ORDER — GABAPENTIN 800 MG PO TABS
800.0000 mg | ORAL_TABLET | Freq: Three times a day (TID) | ORAL | Status: DC
  Filled 2015-09-28 (×2): qty 1

## 2015-09-28 MED ORDER — DEXAMETHASONE SODIUM PHOSPHATE 10 MG/ML IJ SOLN
10.0000 mg | Freq: Four times a day (QID) | INTRAMUSCULAR | Status: DC
  Administered 2015-09-28 – 2015-09-29 (×5): 10 mg via INTRAVENOUS
  Filled 2015-09-28 (×6): qty 1

## 2015-09-28 NOTE — Progress Notes (Signed)
Echocardiogram 2D Echocardiogram has been performed.  [name], [name] M 09/28/2015, 9:38 AM

## 2015-09-28 NOTE — Consult Note (Signed)
Reason for Consult: Cervical herniated disc Referring Physician: Dr. Lisa Roca is an 35 y.o. male.  HPI: The patient is a 35 year old white male who lives in [place] but comes to this area frequently. He was in town visiting his girlfriend and had the acute onset of neck and right arm pain. He was seen at the urgent care and treated with medications. Unfortunately he did not improve. The patient was admitted and worked up with a chest CT which demonstrated a pulmonary embolism. He was started on heparin. He was worked up further with a cervical MRI which demonstrated a herniated disc at C6-7 on the right. A neurosurgical consultation was requested.  Presently the patient is accounted by his girlfriend. He complains of right-sided neck pain with pain radiating down his right arm and what sounds like the C7 distribution. He has numbness and tingling and is index and middle finger. He does not have any radicular symptoms on the left.  History reviewed. No pertinent past medical history.  Past Surgical History  Procedure Laterality Date  . Wisdom tooth extraction    . Veins removed from leg      Family History  Problem Relation Age of Onset  . Stroke Father   . Hypertension Father     Social History:  reports that he has never smoked. He does not have any smokeless tobacco history on file. He reports that he drinks alcohol. His drug history is not on file.  Allergies: No Known Allergies  Medications:  I have reviewed the patient's current medications. Prior to Admission:  Prescriptions prior to admission  Medication Sig Dispense Refill Last Dose  . amphetamine-dextroamphetamine (ADDERALL) 20 MG tablet Take 20 mg by mouth 3 (three) times daily.   09/27/2015 at Unknown time  . cyclobenzaprine (FLEXERIL) 5 MG tablet Take 1 tablet (5 mg total) by mouth 3 (three) times daily as needed for muscle spasms. 20 tablet 0 09/27/2015 at Unknown time  . gabapentin (NEURONTIN) 300 MG  capsule Take 1 tablet at night time for 2 days. Then take 1 twice a day for 1 day then one 3 times a day. 30 capsule 0 09/27/2015 at Unknown time  . predniSONE (DELTASONE) 20 MG tablet Take 3 tabs po on first day, 2 tabs second day, 2 tabs third day, 1 tab fourth day, 1 tab 5th day. Take with food. 9 tablet 0 09/26/2015 at Unknown time  . traMADol (ULTRAM) 50 MG tablet Take 1 tablet (50 mg total) by mouth every 6 (six) hours as needed. 15 tablet 0 09/26/2015 at Unknown time   Scheduled: . amphetamine-dextroamphetamine  20 mg Oral TID   Continuous: . sodium chloride 75 mL/hr at 09/28/15 0600  . heparin 1,800 Units/hr (09/28/15 0819)   HBZ:JIRCVELFYBOFB **OR** acetaminophen, HYDROmorphone (DILAUDID) injection, nitroGLYCERIN, ondansetron **OR** ondansetron (ZOFRAN) IV, oxyCODONE-acetaminophen Anti-infectives    None       Results for orders placed or performed during the hospital encounter of 09/27/15 (from the past 48 hour(s))  Basic metabolic panel     Status: Abnormal   Collection Time: 09/27/15  9:37 PM  Result Value Ref Range   Sodium 138 135 - 145 mmol/L   Potassium 4.1 3.5 - 5.1 mmol/L   Chloride 103 101 - 111 mmol/L   CO2 23 22 - 32 mmol/L   Glucose, Bld 115 (H) 65 - 99 mg/dL   BUN 12 6 - 20 mg/dL   Creatinine, Ser 1.22 0.61 - 1.24 mg/dL   Calcium 9.9  8.9 - 10.3 mg/dL   GFR calc non Af Amer >60 >60 mL/min   GFR calc Af Amer >60 >60 mL/min    Comment: (NOTE) The eGFR has been calculated using the CKD EPI equation. This calculation has not been validated in all clinical situations. eGFR's persistently <60 mL/min signify possible Chronic Kidney Disease.    Anion gap 12 5 - 15  CBC     Status: None   Collection Time: 09/27/15  9:37 PM  Result Value Ref Range   WBC 9.5 4.0 - 10.5 K/uL   RBC 4.96 4.22 - 5.81 MIL/uL   Hemoglobin 15.3 13.0 - 17.0 g/dL   HCT 43.6 39.0 - 52.0 %   MCV 87.9 78.0 - 100.0 fL   MCH 30.8 26.0 - 34.0 pg   MCHC 35.1 30.0 - 36.0 g/dL   RDW 12.4 11.5 -  15.5 %   Platelets 357 150 - 400 K/uL  I-stat troponin, ED     Status: None   Collection Time: 09/27/15  9:51 PM  Result Value Ref Range   Troponin i, poc 0.01 0.00 - 0.08 ng/mL   Comment 3            Comment: Due to the release kinetics of cTnI, a negative result within the first hours of the onset of symptoms does not rule out myocardial infarction with certainty. If myocardial infarction is still suspected, repeat the test at appropriate intervals.   APTT     Status: None   Collection Time: 09/27/15  9:53 PM  Result Value Ref Range   aPTT 31 24 - 37 seconds  Protime-INR     Status: None   Collection Time: 09/27/15  9:53 PM  Result Value Ref Range   Prothrombin Time 13.1 11.6 - 15.2 seconds   INR 0.97 0.00 - 1.49  I-Stat Chem 8, ED     Status: Abnormal   Collection Time: 09/27/15 10:04 PM  Result Value Ref Range   Sodium 140 135 - 145 mmol/L   Potassium 4.0 3.5 - 5.1 mmol/L   Chloride 103 101 - 111 mmol/L   BUN 14 6 - 20 mg/dL   Creatinine, Ser 1.40 (H) 0.61 - 1.24 mg/dL   Glucose, Bld 109 (H) 65 - 99 mg/dL   Calcium, Ion 1.13 1.13 - 1.30 mmol/L   TCO2 25 0 - 100 mmol/L   Hemoglobin 16.0 13.0 - 17.0 g/dL   HCT 47.0 39.0 - 52.0 %  Rapid urine drug screen (hospital performed)     Status: Abnormal   Collection Time: 09/27/15 11:50 PM  Result Value Ref Range   Opiates POSITIVE (A) NONE DETECTED   Cocaine NONE DETECTED NONE DETECTED   Benzodiazepines NONE DETECTED NONE DETECTED   Amphetamines POSITIVE (A) NONE DETECTED   Tetrahydrocannabinol POSITIVE (A) NONE DETECTED   Barbiturates NONE DETECTED NONE DETECTED    Comment:        DRUG SCREEN FOR MEDICAL PURPOSES ONLY.  IF CONFIRMATION IS NEEDED FOR ANY PURPOSE, NOTIFY LAB WITHIN 5 DAYS.        LOWEST DETECTABLE LIMITS FOR URINE DRUG SCREEN Drug Class       Cutoff (ng/mL) Amphetamine      1000 Barbiturate      200 Benzodiazepine   357 Tricyclics       017 Opiates          300 Cocaine          300 THC  50   MRSA PCR Screening     Status: None   Collection Time: 09/28/15  1:44 AM  Result Value Ref Range   MRSA by PCR NEGATIVE NEGATIVE    Comment:        The GeneXpert MRSA Assay (FDA approved for NASAL specimens only), is one component of a comprehensive MRSA colonization surveillance program. It is not intended to diagnose MRSA infection nor to guide or monitor treatment for MRSA infections.   Troponin I (q 6hr x 3)     Status: None   Collection Time: 09/28/15  1:59 AM  Result Value Ref Range   Troponin I <0.03 <0.03 ng/mL  CBC     Status: Abnormal   Collection Time: 09/28/15  1:59 AM  Result Value Ref Range   WBC 7.4 4.0 - 10.5 K/uL   RBC 4.43 4.22 - 5.81 MIL/uL   Hemoglobin 12.9 (L) 13.0 - 17.0 g/dL   HCT 39.7 39.0 - 52.0 %   MCV 89.6 78.0 - 100.0 fL   MCH 29.1 26.0 - 34.0 pg   MCHC 32.5 30.0 - 36.0 g/dL   RDW 12.4 11.5 - 15.5 %   Platelets 305 150 - 400 K/uL  Comprehensive metabolic panel     Status: Abnormal   Collection Time: 09/28/15  1:59 AM  Result Value Ref Range   Sodium 138 135 - 145 mmol/L   Potassium 4.0 3.5 - 5.1 mmol/L   Chloride 104 101 - 111 mmol/L   CO2 24 22 - 32 mmol/L   Glucose, Bld 111 (H) 65 - 99 mg/dL   BUN 12 6 - 20 mg/dL   Creatinine, Ser 1.32 (H) 0.61 - 1.24 mg/dL   Calcium 8.6 (L) 8.9 - 10.3 mg/dL   Total Protein 6.0 (L) 6.5 - 8.1 g/dL   Albumin 3.9 3.5 - 5.0 g/dL   AST 19 15 - 41 U/L   ALT 19 17 - 63 U/L   Alkaline Phosphatase 47 38 - 126 U/L   Total Bilirubin 0.5 0.3 - 1.2 mg/dL   GFR calc non Af Amer >60 >60 mL/min   GFR calc Af Amer >60 >60 mL/min    Comment: (NOTE) The eGFR has been calculated using the CKD EPI equation. This calculation has not been validated in all clinical situations. eGFR's persistently <60 mL/min signify possible Chronic Kidney Disease.    Anion gap 10 5 - 15  Heparin level (unfractionated)     Status: Abnormal   Collection Time: 09/28/15  6:25 AM  Result Value Ref Range   Heparin Unfractionated  0.26 (L) 0.30 - 0.70 IU/mL    Comment:        IF HEPARIN RESULTS ARE BELOW EXPECTED VALUES, AND PATIENT DOSAGE HAS BEEN CONFIRMED, SUGGEST FOLLOW UP TESTING OF ANTITHROMBIN III LEVELS.   Troponin I (q 6hr x 3)     Status: None   Collection Time: 09/28/15  6:25 AM  Result Value Ref Range   Troponin I <0.03 <0.03 ng/mL  Brain natriuretic peptide     Status: None   Collection Time: 09/28/15  6:25 AM  Result Value Ref Range   B Natriuretic Peptide 6.5 0.0 - 100.0 pg/mL    Mr Cervical Spine Wo Contrast  09/28/2015  CLINICAL DATA:  Initial evaluation for mid and upper back pain with right arm numbness. EXAM: MRI CERVICAL SPINE WITHOUT CONTRAST TECHNIQUE: Multiplanar, multisequence MR imaging of the cervical spine was performed. No intravenous contrast was administered. COMPARISON:  None. FINDINGS: Alignment:  Study degraded by motion artifact. Vertebral bodies normally aligned with preservation of the normal cervical lordosis. No listhesis or subluxation. Vertebrae: Vertebral body heights preserved. No acute or chronic fracture. Reactive endplate changes present about the C6-7 intervertebral disc space. Signal intensity within the vertebral body bone marrow otherwise unremarkable. No focal osseous lesions. No other marrow edema. Cord: Signal intensity within the cervical spinal cord is normal. Posterior Fossa, vertebral arteries, paraspinal tissues: Visualized portions of the brain and posterior fossa are normal. Craniocervical junction normal. Paraspinous soft tissues demonstrate no acute abnormality. No paraspinous or prevertebral edema. Normal intravascular flow voids present within the vertebral arteries bilaterally. Disc levels: C2-C3: Mild bilateral uncovertebral hypertrophy without significant stenosis. C3-C4: Shallow posterior disc bulge with minimal bilateral uncovertebral spurring. Right-sided facet arthrosis. Resultant mild right foraminal narrowing. Central canal and left neural foramen  remain widely patent. C4-C5: Shallow central disc protrusion indents the ventral thecal sac without significant canal stenosis (series 7, image 14). Superimposed bilateral uncovertebral spurring and facet arthrosis. Resultant mild right foraminal stenosis. No significant left foraminal narrowing. C5-C6: Shallow posterior disc bulge with bilateral uncovertebral spurring. Bulging disc mildly effaces the ventral thecal sac with resultant mild canal stenosis. Minimal bilateral foraminal narrowing bilaterally. C6-C7: Prominent right foraminal disc extrusion extending into the right neural foramen (series 3, image 5). Slight cephalad angulation. Resultant severe right foraminal stenosis. Extruded disc potentially affects the right C7 nerve root. There is flattening of the right hemi cord without cord signal changes. Mild to moderate canal stenosis. Mild left foraminal narrowing related disc bulge and uncovertebral spurring as well. C7-T1:  Negative. Visualized upper thoracic spine demonstrates no acute abnormality. No significant stenosis. IMPRESSION: 1. Right foraminal disc extrusion at C6-7 with resultant severe right foraminal stenosis, potentially affecting the right C7 nerve root. Resultant mild to moderate canal narrowing with flattening of the right hemi cord without cord signal changes. 2. Additional more mild multilevel degenerative spondylolysis as detailed above. Electronically Signed   By: Jeannine Boga M.D.   On: 09/28/2015 05:17   Dg Chest Portable 1 View  09/27/2015  CLINICAL DATA:  Shortness of breath and chest pain EXAM: PORTABLE CHEST 1 VIEW COMPARISON:  None. FINDINGS: Lungs are clear. Heart size and pulmonary vascularity are normal. No adenopathy. No pneumothorax. No bone lesions. IMPRESSION: No edema or consolidation. Electronically Signed   By: Lowella Grip III M.D.   On: 09/27/2015 22:03   Ct Angio Chest/abd/pel For Dissection W And/or W/wo  09/27/2015  CLINICAL DATA:  Chest pain  radiating into the back and right arm. Pain for 2 days. EXAM: CT ANGIOGRAPHY CHEST, ABDOMEN AND PELVIS TECHNIQUE: Multidetector CT imaging through the chest, abdomen and pelvis was performed using the standard protocol during bolus administration of intravenous contrast. Multiplanar reconstructed images and MIPs were obtained and reviewed to evaluate the vascular anatomy. CONTRAST:  100 cc Isovue 370 IV COMPARISON:  Chest radiograph earlier this day FINDINGS: CTA CHEST FINDINGS Cardiovascular: No aortic dissection, aneurysm, hematoma or acute aortic injury. Conventional branching pattern from the aortic arch. Though not tailored for pulmonary embolus evaluation there are filling defects suspected in the segmental right lower lobe pulmonary arteries extending into subsegmental branches. Borderline straightening of the intraventricular septum and RV to LV ratio of 1. Mediastinum/nodes:  No adenopathy or pericardial fluid. Lungs: Mild motion artifact. No consolidation, pulmonary edema or pleural fluid. No pulmonary mass. Musculoskeletal: There are no acute or suspicious osseous abnormalities. Review of the MIP images confirms the above findings. CTA ABDOMEN AND PELVIS  FINDINGS Liver: No focal lesion allowing for arterial phase imaging. Hepatobiliary: Gallbladder physiologically distended, no calcified stone. No biliary dilatation. Pancreas: No ductal dilatation or inflammation. Spleen: Normal. Adrenal glands: No nodule. Kidneys: Symmetric renal enhancement.  No hydronephrosis. Stomach/Bowel: Stomach distended with ingested contents. There are no dilated or thickened small bowel loops. Small volume of stool throughout the colon without colonic wall thickening. The appendix is normal. Vascular/Lymphatic: Normal abdominal aorta without dissection or aneurysm. No significant atherosclerosis. Patent celiac, superior mesenteric, and inferior mesenteric arteries. Patent single bilateral renal arteries. Normal caliber iliac  arteries. No retroperitoneal adenopathy. Reproductive: No acute abnormality. Bladder: Physiologically distended without wall thickening. Other: No free air, free fluid, or intra-abdominal fluid collection. Surgical clips in the left inguinal region. Tiny fat containing umbilical hernia. Musculoskeletal: There are no acute or suspicious osseous abnormalities. Mild scoliotic curvature of the lumbar spine and degenerative change. Review of the MIP images confirms the above findings. IMPRESSION: 1. Right lower lobe segmental and subsegmental pulmonary emboli. Borderline right heart strain, RV to LV ratio of 1. 2. Normal thoracoabdominal aorta.  No aortic dissection. Critical Value/emergent results were called by telephone at the time of interpretation on 09/27/2015 at 10:49 pm to Dr. Stark Jock, who verbally acknowledged these results. Electronically Signed   By: Jeb Levering M.D.   On: 09/27/2015 22:49    ROS Blood pressure 132/121, pulse 97, temperature 98.2 F (36.8 C), temperature source Oral, resp. rate 16, height 6' 3.98" (1.93 m), weight 104.327 kg (230 lb), SpO2 100 %. Physical Exam  General: An alert and pleasant 35 year old white male who complains of right arm pain.  HEENT: Normocephalic, atraumatic, pupils equal round reactive light, extraocular muscles are intact  Neck: Supple without deformity. Spurling's testing is positive on the right.  Thorax: Symmetric  Abdomen: Soft  Extremities: Unremarkable  Neurologic exam: The patient is alert and oriented 3. Cranial nerves II through XII were examined bilaterally and grossly normal. Vision and hearing are grossly normal bilaterally. Motor strength is 5 over 5 in his bowel deltoid, biceps, handgrip, gastrocnemius, dorsiflexors, left tricep. The patient has weakness in his right triceps at 4+ over 5. Cerebellar exam is intact to rapid alternate movements of the upper extremities bilaterally. Sensory exam demonstrates numbness in the right C7  distribution and is otherwise unremarkable.  I have reviewed the patient's cervical MRI performed at [facility] today. It demonstrates the patient has a right herniated disc at C6-7. There is neural foraminal stenosis but no significant spinal cord compression. The other levels are relatively unremarkable.  Assessment/Plan: Right C6 herniated disc, cervicalgia, cervical radiculopathy: I have discussed the situation with the patient and his girlfriend. We have discussed the various treatment options. Given the fact that he has been recently diagnosed with a pulmonary embolism and is anticoagulated surgery is not an option presently. I'll start him on steroids and gabapentin to hopefully reduce his radicular symptoms. Hopefully his symptoms will resolve. If not perhaps we can do surgery  after he's been anticoagulated for a few weeks.  Pulmonary embolism: Noted  Kenslei Hearty D 09/28/2015, 11:06 AM

## 2015-09-28 NOTE — Progress Notes (Signed)
ANTICOAGULATION CONSULT NOTE  Pharmacy Consult for Heparin > Xarelto Indication: pulmonary embolus, new onset  No Known Allergies  Patient Measurements: Height: 6' 3.98" (193 cm) Weight: 230 lb (104.327 kg) IBW/kg (Calculated) : 86.76  Vital Signs: Temp: 98.7 F (37.1 C) (07/19 1149) Temp Source: Oral (07/19 1149) BP: 136/93 mmHg (07/19 1149)  Labs:  Recent Labs  09/27/15 2137 09/27/15 2153 09/27/15 2204 09/28/15 0159 09/28/15 0625 09/28/15 1157  HGB 15.3  --  16.0 12.9*  --   --   HCT 43.6  --  47.0 39.7  --   --   PLT 357  --   --  305  --   --   APTT  --  31  --   --   --   --   LABPROT  --  13.1  --   --   --   --   INR  --  0.97  --   --   --   --   HEPARINUNFRC  --   --   --   --  0.26*  --   CREATININE 1.22  --  1.40* 1.32*  --   --   TROPONINI  --   --   --  <0.03 <0.03 <0.03    Estimated Creatinine Clearance: 103.6 mL/min (by C-G formula based on Cr of 1.32).   Assessment: 35 y/o M with new onset PE per CT Angio. No DVT on dopplers, hx of DVT at 35 yo. No AC pta. Started on IV heparin, now transitioning to Xarelto. Hg down 12.9, plt wnl, no bleed documented.  Communicated with RN to d/c heparin drip at time of 1st dose of Xarelto.  Goal of Therapy:  Treatment of PE Monitor platelets by anticoagulation protocol: Yes   Plan:  -D/c heparin. Xarelto 15mg  bid w/ meals x 21d; then 20mg  Qsupper -Monitor CBC, s/sx bleeding  Babs BertinHaley Celedonio Sortino, PharmD, [facility] Clinical Pharmacist Pager [phone] 09/28/2015 1:57 PM

## 2015-09-28 NOTE — ED Notes (Signed)
Report attempted, RN to call back. 

## 2015-09-28 NOTE — Progress Notes (Signed)
Patient seen and examined at bedside. Patient admitted after midnight please see earlier admission note by Dr. Toniann FailKakrakandy. Patient admitted for evaluation of shortness of breath and back pain, right upper extremity numbness and tingling. Diagnostic studies consistent with acute pulmonary embolus, MRI of the cervical spine shows right foraminal disc extrusion at C6-C7 with severe right foraminal stenosis. Patient started on heparin. Neurosurgery consulted for further assistance. Analgesia as needed requested.  Troy PrestoMAGICK-Nahal Wanless, MD  [facility] Pager [phone]  If 7PM-7AM, please contact night-coverage [email or web address] Password [email or web address]

## 2015-09-28 NOTE — Progress Notes (Signed)
*  PRELIMINARY RESULTS* Vascular Ultrasound Lower extremity venous duplex has been completed.  Preliminary findings: No evidence of DVT or baker's cyst.   Farrel DemarkJill Eunice, RDMS, RVT  09/28/2015, 11:39 AM

## 2015-09-28 NOTE — H&P (Signed)
History and Physical    Troy Bell ZOX:[id number] DOB: 08/18/1980 DOA: 09/27/2015  PCP: No PCP Per Patient  Patient coming from: Home. Patient is visiting from [place].  Chief Complaint: Chest pain shortness of breath and back pain.  HPI: Troy Bell is a 35 y.o. male with ADHD on Adderall is visiting [place] from [place]. Patient came to [facility] last week and since then has been having upper back pain pleuritic in nature. Patient also has been having right upper extremity numbness for the same period of time. Patient had originally gone to the urgent care center last week and was treated with pain medication and steroids. Despite this patient's upper back pain worsened with shortness of breath and also started developing anterior chest pain. Patient was tachycardic in the ER. CT scan of the chest shows pulmonary embolism with borderline heart strain. On my exam, patient is hemodynamically stable and not in distress. Patient states last week he also had some weakness of the right upper extremity along with this numbness. On exam patient is able to move all extremities with mild weakness of the right upper extremity. Patient denies any family history of PE. Patient states he has had a clot in his left leg when he was 35 years old and has had his veins removed.   ED Course: Patient was started on IV heparin for PE.  Review of Systems: As per HPI, rest all negative.   History reviewed. No pertinent past medical history.  Past Surgical History  Procedure Laterality Date  . Wisdom tooth extraction    . Veins removed from leg       reports that he has never smoked. He does not have any smokeless tobacco history on file. He reports that he drinks alcohol. His drug history is not on file.  No Known Allergies  Family History  Problem Relation Age of Onset  . Stroke Father   . Hypertension Father     Prior to Admission medications   Medication Sig Start Date End Date  Taking? Authorizing Provider  amphetamine-dextroamphetamine (ADDERALL) 20 MG tablet Take 20 mg by mouth 3 (three) times daily.   Yes Historical Provider, MD  cyclobenzaprine (FLEXERIL) 5 MG tablet Take 1 tablet (5 mg total) by mouth 3 (three) times daily as needed for muscle spasms. 09/20/15  Yes Hayden Rasmussen, NP  gabapentin (NEURONTIN) 300 MG capsule Take 1 tablet at night time for 2 days. Then take 1 twice a day for 1 day then one 3 times a day. 09/20/15  Yes Hayden Rasmussen, NP  predniSONE (DELTASONE) 20 MG tablet Take 3 tabs po on first day, 2 tabs second day, 2 tabs third day, 1 tab fourth day, 1 tab 5th day. Take with food. 09/20/15  Yes Hayden Rasmussen, NP  traMADol (ULTRAM) 50 MG tablet Take 1 tablet (50 mg total) by mouth every 6 (six) hours as needed. 09/20/15  Yes Hayden Rasmussen, NP    Physical Exam: Filed Vitals:   09/27/15 2300 09/27/15 2315 09/27/15 2330 09/27/15 2345  BP: 145/95 149/92 140/89 153/94  Pulse: 92 99 83 97  Temp:      TempSrc:      Resp: 11 12    Height:      Weight:      SpO2: 99% 98% 99% 100%      Constitutional: Not in distress. Filed Vitals:   09/27/15 2300 09/27/15 2315 09/27/15 2330 09/27/15 2345  BP: 145/95 149/92 140/89 153/94  Pulse: 92 99 83 97  Temp:      TempSrc:      Resp: 11 12    Height:      Weight:      SpO2: 99% 98% 99% 100%   Eyes: Anicteric no pallor. ENMT: No discharge from the ears eyes nose or mouth. Neck: No mass felt. No JVD appreciated. Respiratory: No rhonchi or crepitations. Cardiovascular: S1 and S2 heard. Abdomen: Soft nontender bowel sounds present. Musculoskeletal: No edema. Skin: No rash. Neurologic: Alert awake oriented to time place and person. Moves all extremities. Psychiatric: Appears normal.   Labs on Admission: I have personally reviewed following labs and imaging studies  CBC:  Recent Labs Lab 09/27/15 2137 09/27/15 2204  WBC 9.5  --   HGB 15.3 16.0  HCT 43.6 47.0  MCV 87.9  --   PLT 357  --    Basic  Metabolic Panel:  Recent Labs Lab 09/27/15 2137 09/27/15 2204  NA 138 140  K 4.1 4.0  CL 103 103  CO2 23  --   GLUCOSE 115* 109*  BUN 12 14  CREATININE 1.22 1.40*  CALCIUM 9.9  --    GFR: Estimated Creatinine Clearance: 97.7 mL/min (by C-G formula based on Cr of 1.4). Liver Function Tests: No results for input(s): AST, ALT, ALKPHOS, BILITOT, PROT, ALBUMIN in the last 168 hours. No results for input(s): LIPASE, AMYLASE in the last 168 hours. No results for input(s): AMMONIA in the last 168 hours. Coagulation Profile:  Recent Labs Lab 09/27/15 2153  INR 0.97   Cardiac Enzymes: No results for input(s): CKTOTAL, CKMB, CKMBINDEX, TROPONINI in the last 168 hours. BNP (last 3 results) No results for input(s): PROBNP in the last 8760 hours. HbA1C: No results for input(s): HGBA1C in the last 72 hours. CBG: No results for input(s): GLUCAP in the last 168 hours. Lipid Profile: No results for input(s): CHOL, HDL, LDLCALC, TRIG, CHOLHDL, LDLDIRECT in the last 72 hours. Thyroid Function Tests: No results for input(s): TSH, T4TOTAL, FREET4, T3FREE, THYROIDAB in the last 72 hours. Anemia Panel: No results for input(s): VITAMINB12, FOLATE, FERRITIN, TIBC, IRON, RETICCTPCT in the last 72 hours. Urine analysis: No results found for: COLORURINE, APPEARANCEUR, LABSPEC, PHURINE, GLUCOSEU, HGBUR, BILIRUBINUR, KETONESUR, PROTEINUR, UROBILINOGEN, NITRITE, LEUKOCYTESUR Sepsis Labs: @LABRCNTIP (procalcitonin:4,lacticidven:4) )No results found for this or any previous visit (from the past 240 hour(s)).   Radiological Exams on Admission: Dg Chest Portable 1 View  09/27/2015  CLINICAL DATA:  Shortness of breath and chest pain EXAM: PORTABLE CHEST 1 VIEW COMPARISON:  None. FINDINGS: Lungs are clear. Heart size and pulmonary vascularity are normal. No adenopathy. No pneumothorax. No bone lesions. IMPRESSION: No edema or consolidation. Electronically Signed   By: Bretta Bang III M.D.   On:  09/27/2015 22:03   Ct Angio Chest/abd/pel For Dissection W And/or W/wo  09/27/2015  CLINICAL DATA:  Chest pain radiating into the back and right arm. Pain for 2 days. EXAM: CT ANGIOGRAPHY CHEST, ABDOMEN AND PELVIS TECHNIQUE: Multidetector CT imaging through the chest, abdomen and pelvis was performed using the standard protocol during bolus administration of intravenous contrast. Multiplanar reconstructed images and MIPs were obtained and reviewed to evaluate the vascular anatomy. CONTRAST:  100 cc Isovue 370 IV COMPARISON:  Chest radiograph earlier this day FINDINGS: CTA CHEST FINDINGS Cardiovascular: No aortic dissection, aneurysm, hematoma or acute aortic injury. Conventional branching pattern from the aortic arch. Though not tailored for pulmonary embolus evaluation there are filling defects suspected in the segmental right lower lobe pulmonary arteries extending into subsegmental  branches. Borderline straightening of the intraventricular septum and RV to LV ratio of 1. Mediastinum/nodes:  No adenopathy or pericardial fluid. Lungs: Mild motion artifact. No consolidation, pulmonary edema or pleural fluid. No pulmonary mass. Musculoskeletal: There are no acute or suspicious osseous abnormalities. Review of the MIP images confirms the above findings. CTA ABDOMEN AND PELVIS FINDINGS Liver: No focal lesion allowing for arterial phase imaging. Hepatobiliary: Gallbladder physiologically distended, no calcified stone. No biliary dilatation. Pancreas: No ductal dilatation or inflammation. Spleen: Normal. Adrenal glands: No nodule. Kidneys: Symmetric renal enhancement.  No hydronephrosis. Stomach/Bowel: Stomach distended with ingested contents. There are no dilated or thickened small bowel loops. Small volume of stool throughout the colon without colonic wall thickening. The appendix is normal. Vascular/Lymphatic: Normal abdominal aorta without dissection or aneurysm. No significant atherosclerosis. Patent celiac,  superior mesenteric, and inferior mesenteric arteries. Patent single bilateral renal arteries. Normal caliber iliac arteries. No retroperitoneal adenopathy. Reproductive: No acute abnormality. Bladder: Physiologically distended without wall thickening. Other: No free air, free fluid, or intra-abdominal fluid collection. Surgical clips in the left inguinal region. Tiny fat containing umbilical hernia. Musculoskeletal: There are no acute or suspicious osseous abnormalities. Mild scoliotic curvature of the lumbar spine and degenerative change. Review of the MIP images confirms the above findings. IMPRESSION: 1. Right lower lobe segmental and subsegmental pulmonary emboli. Borderline right heart strain, RV to LV ratio of 1. 2. Normal thoracoabdominal aorta.  No aortic dissection. Critical Value/emergent results were called by telephone at the time of interpretation on 09/27/2015 at 10:49 pm to Dr. Judd Lienelo, who verbally acknowledged these results. Electronically Signed   By: Rubye OaksMelanie  Ehinger M.D.   On: 09/27/2015 22:49    EKG: Independently reviewed. Sinus tachycardia.  Assessment/Plan Principal Problem:   Pulmonary embolism (HCC) Active Problems:   Right upper extremity numbness   Elevated blood pressure    1. Pulmonary embolism - unprovoked. Patient is not in distress and hemodynamically stable. CT scan has been read as moderate right heart strain. I did discuss with on-call pulmonary critical care Dr. Kendrick FriesMcQuaid, who at this time as advised to get troponin and BNP and 2-D echo. If 2-D echo does show some strain pattern to formally consult pulmonary critical care. Patient is on IV heparin which will be continued for now. Closely monitor in stepdown. Check Dopplers of the lower extremity. 2. Right upper extremity numbness - patient states he has been having right upper extremity numbness extending to his thumb and index finger and middle finger from the neck down. Has been having this for last 1 week. Denies any  fall or trauma. I did discuss with Dr. Roxy Mannsster on-call neurologist who advised to get MRI C-spine. 3. History of ADHD on Adderall.   DVT prophylaxis: Heparin infusion. Code Status: Full code.  Family Communication: Discussed with patient.  Disposition Plan: Home.  Consults called: Discussed with pulmonary critical care and neurologist.  Admission status: Observation. Stepdown.    Eduard ClosKAKRAKANDY,Neema Fluegge N. MD [facility] Pager [phone].  If 7PM-7AM, please contact night-coverage [email or web address] Password [email or web address]  09/28/2015, 12:39 AM

## 2015-09-29 ENCOUNTER — Telehealth: Payer: Self-pay | Admitting: Emergency Medicine

## 2015-09-29 DIAGNOSIS — R030 Elevated blood-pressure reading, without diagnosis of hypertension: Secondary | ICD-10-CM

## 2015-09-29 DIAGNOSIS — I2699 Other pulmonary embolism without acute cor pulmonale: Secondary | ICD-10-CM | POA: Diagnosis not present

## 2015-09-29 LAB — BASIC METABOLIC PANEL
ANION GAP: 6 (ref 5–15)
BUN: 11 mg/dL (ref 6–20)
CALCIUM: 9.1 mg/dL (ref 8.9–10.3)
CO2: 25 mmol/L (ref 22–32)
Chloride: 104 mmol/L (ref 101–111)
Creatinine, Ser: 1.12 mg/dL (ref 0.61–1.24)
GFR calc Af Amer: >60 mL/min (ref >60)
GLUCOSE: 142 mg/dL — AB (ref 65–99)
POTASSIUM: 4.2 mmol/L (ref 3.5–5.1)
SODIUM: 135 mmol/L (ref 135–145)

## 2015-09-29 MED ORDER — CYCLOBENZAPRINE HCL 5 MG PO TABS: 5.0000 mg | ORAL_TABLET | Freq: Three times a day (TID) | ORAL | Status: DC | PRN

## 2015-09-29 MED ORDER — RIVAROXABAN 15 MG PO TABS: 15.0000 mg | ORAL_TABLET | Freq: Two times a day (BID) | ORAL | Status: AC

## 2015-09-29 MED ORDER — METHYLPREDNISOLONE 4 MG PO TBPK: 8.0000 mg | ORAL_TABLET | Freq: Every evening | ORAL | Status: DC

## 2015-09-29 MED ORDER — OXYCODONE-ACETAMINOPHEN 5-325 MG PO TABS: 1.0000 | ORAL_TABLET | ORAL | Status: DC | PRN

## 2015-09-29 MED ORDER — RIVAROXABAN 20 MG PO TABS: 20.0000 mg | ORAL_TABLET | Freq: Every day | ORAL | Status: DC

## 2015-09-29 MED ORDER — METHYLPREDNISOLONE 4 MG PO TBPK: ORAL_TABLET | ORAL | Status: AC

## 2015-09-29 MED ORDER — METHYLPREDNISOLONE 4 MG PO TBPK: 4.0000 mg | ORAL_TABLET | Freq: Four times a day (QID) | ORAL | Status: DC

## 2015-09-29 MED ORDER — METHYLPREDNISOLONE 4 MG PO TBPK
8.0000 mg | ORAL_TABLET | Freq: Every morning | ORAL | Status: DC
  Filled 2015-09-29: qty 21

## 2015-09-29 MED ORDER — OXYCODONE-ACETAMINOPHEN 5-325 MG PO TABS: 1.0000 | ORAL_TABLET | ORAL | Status: AC | PRN

## 2015-09-29 MED ORDER — METHYLPREDNISOLONE 4 MG PO TBPK: ORAL_TABLET | ORAL | Status: DC

## 2015-09-29 MED ORDER — CYCLOBENZAPRINE HCL 5 MG PO TABS: 5.0000 mg | ORAL_TABLET | Freq: Three times a day (TID) | ORAL | Status: AC | PRN

## 2015-09-29 MED ORDER — RIVAROXABAN 20 MG PO TABS: 20.0000 mg | ORAL_TABLET | Freq: Every day | ORAL | Status: AC

## 2015-09-29 MED ORDER — METHYLPREDNISOLONE 4 MG PO TBPK: 4.0000 mg | ORAL_TABLET | ORAL | Status: DC

## 2015-09-29 MED ORDER — GABAPENTIN 100 MG PO CAPS: 300.0000 mg | ORAL_CAPSULE | Freq: Three times a day (TID) | ORAL | Status: DC

## 2015-09-29 MED ORDER — RIVAROXABAN 15 MG PO TABS
15.0000 mg | ORAL_TABLET | Freq: Two times a day (BID) | ORAL | Status: DC
Start: 2015-09-29 — End: 2015-09-29

## 2015-09-29 MED ORDER — GABAPENTIN 100 MG PO CAPS: 300.0000 mg | ORAL_CAPSULE | Freq: Three times a day (TID) | ORAL | Status: AC

## 2015-09-29 MED ORDER — METHYLPREDNISOLONE 4 MG PO TBPK: 4.0000 mg | ORAL_TABLET | Freq: Three times a day (TID) | ORAL | Status: DC

## 2015-09-29 MED ORDER — METHYLPREDNISOLONE 4 MG PO TBPK
4.0000 mg | ORAL_TABLET | ORAL | Status: AC
  Administered 2015-09-29: 4 mg via ORAL
  Filled 2015-09-29: qty 21

## 2015-09-29 NOTE — Telephone Encounter (Signed)
[facility] Secretary called to make a new pt hosp fu for this pt with Dr Okey Duprerawford. I Explained she was not excepting new pts but she said Dr Troy BinderIskra Bell recommended her. Please follow up with this. She said he was suppose to be discharged today so she was hoping to go ahead and make an appointment. Thanks.

## 2015-09-29 NOTE — Progress Notes (Signed)
Patient ID: Troy Bell, male   DOB: [birth year], 35 y.o.   MRN: [id number] Subjective:  The patient is alert and pleasant. He looks and feels much better.  Objective: Vital signs in last 24 hours: Temp:  [97.7 F (36.5 C)-98.7 F (37.1 C)] 97.7 F (36.5 C) (07/20 0800) Resp:  [11-20] 20 (07/20 0800) BP: (125-152)/(76-113) 140/98 mmHg (07/20 0800) SpO2:  [93 %-99 %] 94 % (07/20 0800)  Intake/Output from previous day: 07/19 0701 - 07/20 0700 In: 2186.7 [P.O.:1080; I.V.:1106.7] Out: 3000 [Urine:3000] Intake/Output this shift:    Physical exam the patient is alert and oriented. He is moving all 4 extremities well.  Lab Results:  Recent Labs  09/27/15 2137 09/27/15 2204 09/28/15 0159  WBC 9.5  --  7.4  HGB 15.3 16.0 12.9*  HCT 43.6 47.0 39.7  PLT 357  --  305   BMET  Recent Labs  09/28/15 0159 09/29/15 0242  NA 138 135  K 4.0 4.2  CL 104 104  CO2 24 25  GLUCOSE 111* 142*  BUN 12 11  CREATININE 1.32* 1.12  CALCIUM 8.6* 9.1    Studies/Results: Mr Cervical Spine Wo Contrast  09/28/2015  CLINICAL DATA:  Initial evaluation for mid and upper back pain with right arm numbness. EXAM: MRI CERVICAL SPINE WITHOUT CONTRAST TECHNIQUE: Multiplanar, multisequence MR imaging of the cervical spine was performed. No intravenous contrast was administered. COMPARISON:  None. FINDINGS: Alignment: Study degraded by motion artifact. Vertebral bodies normally aligned with preservation of the normal cervical lordosis. No listhesis or subluxation. Vertebrae: Vertebral body heights preserved. No acute or chronic fracture. Reactive endplate changes present about the C6-7 intervertebral disc space. Signal intensity within the vertebral body bone marrow otherwise unremarkable. No focal osseous lesions. No other marrow edema. Cord: Signal intensity within the cervical spinal cord is normal. Posterior Fossa, vertebral arteries, paraspinal tissues: Visualized portions of the brain and posterior  fossa are normal. Craniocervical junction normal. Paraspinous soft tissues demonstrate no acute abnormality. No paraspinous or prevertebral edema. Normal intravascular flow voids present within the vertebral arteries bilaterally. Disc levels: C2-C3: Mild bilateral uncovertebral hypertrophy without significant stenosis. C3-C4: Shallow posterior disc bulge with minimal bilateral uncovertebral spurring. Right-sided facet arthrosis. Resultant mild right foraminal narrowing. Central canal and left neural foramen remain widely patent. C4-C5: Shallow central disc protrusion indents the ventral thecal sac without significant canal stenosis (series 7, image 14). Superimposed bilateral uncovertebral spurring and facet arthrosis. Resultant mild right foraminal stenosis. No significant left foraminal narrowing. C5-C6: Shallow posterior disc bulge with bilateral uncovertebral spurring. Bulging disc mildly effaces the ventral thecal sac with resultant mild canal stenosis. Minimal bilateral foraminal narrowing bilaterally. C6-C7: Prominent right foraminal disc extrusion extending into the right neural foramen (series 3, image 5). Slight cephalad angulation. Resultant severe right foraminal stenosis. Extruded disc potentially affects the right C7 nerve root. There is flattening of the right hemi cord without cord signal changes. Mild to moderate canal stenosis. Mild left foraminal narrowing related disc bulge and uncovertebral spurring as well. C7-T1:  Negative. Visualized upper thoracic spine demonstrates no acute abnormality. No significant stenosis. IMPRESSION: 1. Right foraminal disc extrusion at C6-7 with resultant severe right foraminal stenosis, potentially affecting the right C7 nerve root. Resultant mild to moderate canal narrowing with flattening of the right hemi cord without cord signal changes. 2. Additional more mild multilevel degenerative spondylolysis as detailed above. Electronically Signed   By: Rise Mu M.D.   On: 09/28/2015 05:17   Dg Chest Portable 1 View  09/27/2015  CLINICAL DATA:  Shortness of breath and chest pain EXAM: PORTABLE CHEST 1 VIEW COMPARISON:  None. FINDINGS: Lungs are clear. Heart size and pulmonary vascularity are normal. No adenopathy. No pneumothorax. No bone lesions. IMPRESSION: No edema or consolidation. Electronically Signed   By: Bretta BangWilliam  Woodruff III M.D.   On: 09/27/2015 22:03   Ct Angio Chest/abd/pel For Dissection W And/or W/wo  09/27/2015  CLINICAL DATA:  Chest pain radiating into the back and right arm. Pain for 2 days. EXAM: CT ANGIOGRAPHY CHEST, ABDOMEN AND PELVIS TECHNIQUE: Multidetector CT imaging through the chest, abdomen and pelvis was performed using the standard protocol during bolus administration of intravenous contrast. Multiplanar reconstructed images and MIPs were obtained and reviewed to evaluate the vascular anatomy. CONTRAST:  100 cc Isovue 370 IV COMPARISON:  Chest radiograph earlier this day FINDINGS: CTA CHEST FINDINGS Cardiovascular: No aortic dissection, aneurysm, hematoma or acute aortic injury. Conventional branching pattern from the aortic arch. Though not tailored for pulmonary embolus evaluation there are filling defects suspected in the segmental right lower lobe pulmonary arteries extending into subsegmental branches. Borderline straightening of the intraventricular septum and RV to LV ratio of 1. Mediastinum/nodes:  No adenopathy or pericardial fluid. Lungs: Mild motion artifact. No consolidation, pulmonary edema or pleural fluid. No pulmonary mass. Musculoskeletal: There are no acute or suspicious osseous abnormalities. Review of the MIP images confirms the above findings. CTA ABDOMEN AND PELVIS FINDINGS Liver: No focal lesion allowing for arterial phase imaging. Hepatobiliary: Gallbladder physiologically distended, no calcified stone. No biliary dilatation. Pancreas: No ductal dilatation or inflammation. Spleen: Normal. Adrenal glands:  No nodule. Kidneys: Symmetric renal enhancement.  No hydronephrosis. Stomach/Bowel: Stomach distended with ingested contents. There are no dilated or thickened small bowel loops. Small volume of stool throughout the colon without colonic wall thickening. The appendix is normal. Vascular/Lymphatic: Normal abdominal aorta without dissection or aneurysm. No significant atherosclerosis. Patent celiac, superior mesenteric, and inferior mesenteric arteries. Patent single bilateral renal arteries. Normal caliber iliac arteries. No retroperitoneal adenopathy. Reproductive: No acute abnormality. Bladder: Physiologically distended without wall thickening. Other: No free air, free fluid, or intra-abdominal fluid collection. Surgical clips in the left inguinal region. Tiny fat containing umbilical hernia. Musculoskeletal: There are no acute or suspicious osseous abnormalities. Mild scoliotic curvature of the lumbar spine and degenerative change. Review of the MIP images confirms the above findings. IMPRESSION: 1. Right lower lobe segmental and subsegmental pulmonary emboli. Borderline right heart strain, RV to LV ratio of 1. 2. Normal thoracoabdominal aorta.  No aortic dissection. Critical Value/emergent results were called by telephone at the time of interpretation on 09/27/2015 at 10:49 pm to Dr. Judd Lienelo, who verbally acknowledged these results. Electronically Signed   By: Rubye OaksMelanie  Ehinger M.D.   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 I would suggest that he be discharged with a Medrol Dosepak, Percocet for pain, and Flexeril for muscle spasms. Please have him follow-up with me in the office in a few weeks. If his pain doesn't resolve we can consider surgery after he has had some treatment for his pulmonary embolism. I have answered all his questions. Please call if I can be of further  assistance.  Pulmonary embolism: The patient is anticoagulated and in the process of a hypercoagulability workup.      Amauria Younts D 09/29/2015, 10:24 AM

## 2015-09-29 NOTE — Progress Notes (Signed)
Pt discharged home with girlfriend. All questions answered and new medications reviewed.

## 2015-09-29 NOTE — Discharge Instructions (Signed)
Information on my medicine - XARELTO (rivaroxaban)  This medication education was reviewed with me or my healthcare representative as part of my discharge preparation.  The pharmacist that spoke with me during my hospital stay was:  Almon HerculesBaird, [facility], [facility]  WHY WAS Carlena HurlXARELTO PRESCRIBED FOR YOU? Xarelto was prescribed to treat blood clots that may have been found in the veins of your legs (deep vein thrombosis) or in your lungs (pulmonary embolism) and to reduce the risk of them occurring again.  What do you need to know about Xarelto? The starting dose is one 15 mg tablet taken TWICE daily with food for the FIRST 21 DAYS then on (enter date)  10/20/2015  the dose is changed to one 20 mg tablet taken ONCE A DAY with your evening meal.  DO NOT stop taking Xarelto without talking to the health care provider who prescribed the medication.  Refill your prescription for 20 mg tablets before you run out.  After discharge, you should have regular check-up appointments with your healthcare provider that is prescribing your Xarelto.  In the future your dose may need to be changed if your kidney function changes by a significant amount.  What do you do if you miss a dose? If you are taking Xarelto TWICE DAILY and you miss a dose, take it as soon as you remember. You may take two 15 mg tablets (total 30 mg) at the same time then resume your regularly scheduled 15 mg twice daily the next day.  If you are taking Xarelto ONCE DAILY and you miss a dose, take it as soon as you remember on the same day then continue your regularly scheduled once daily regimen the next day. Do not take two doses of Xarelto at the same time.   Important Safety Information Xarelto is a blood thinner medicine that can cause bleeding. You should call your healthcare provider right away if you experience any of the following: ? Bleeding from an injury or your nose that does not stop. ? Unusual colored urine (red or dark brown) or  unusual colored stools (red or black). ? Unusual bruising for unknown reasons. ? A serious fall or if you hit your head (even if there is no bleeding).  Some medicines may interact with Xarelto and might increase your risk of bleeding while on Xarelto. To help avoid this, consult your healthcare provider or pharmacist prior to using any new prescription or non-prescription medications, including herbals, vitamins, non-steroidal anti-inflammatory drugs (NSAIDs) and supplements.  This website has more information on Xarelto: [email or web address].xarelto.[email or web address].

## 2015-09-29 NOTE — Discharge Summary (Signed)
Physician Discharge Summary  Paul HalfMichael Taras ZOX:[id number] DOB: 04-07-1980 DOA: 09/27/2015  PCP: No PCP Per Patient  Admit date: 09/27/2015 Discharge date: 09/29/2015  Recommendations for Outpatient Follow-up:  1. Pt will need to follow up with PCP in 2-3 weeks post discharge 2. Discharged on Xarelto   Discharge Diagnoses:  Principal Problem:   Pulmonary embolism (HCC) Active Problems:   Right upper extremity numbness   Elevated blood pressure  Discharge Condition: Stable  Diet recommendation: Heart healthy diet discussed in details   History of present illness:  35 y.o. male with ADHD on Adderall [place]. [place] [facility] [place] and since then has been having upper back pain pleuritic in nature. Patient also has been having right upper extremity numbness for the same period of time. Patient had originally gone to the urgent care center last week and was treated with pain medication and steroids. Despite this patient's upper back pain worsened with shortness of breath and also started developing anterior chest pain. Imaging studies in ED notable for acute PE, MRI spine with ? Cervical nerve impingement.   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 - neurosurgeon suggested to be discharged with a Medrol Dosepak, Percocet for pain, and Flexeril for muscle spasms. - - follow-up with Dr. Lovell SheehanJenkins in the office in a few weeks.  - If his pain doesn't resolve, can consider surgery after he has  had some treatment for his pulmonary embolism.     Pre renal azotemia - resolved with IVF    Procedures/Studies: Mr Cervical Spine Wo Contrast  09/28/2015  CLINICAL DATA:  Initial evaluation for mid and upper back pain with right arm numbness. EXAM: MRI CERVICAL SPINE WITHOUT CONTRAST TECHNIQUE: Multiplanar, multisequence MR imaging of the cervical spine was performed. No intravenous contrast was administered. COMPARISON:  None. FINDINGS: Alignment: Study degraded by motion artifact. Vertebral bodies normally aligned with preservation of the normal cervical lordosis. No listhesis or subluxation. Vertebrae: Vertebral body heights preserved. No acute or chronic fracture. Reactive endplate changes present about the C6-7 intervertebral disc space. Signal intensity within the vertebral body bone marrow otherwise unremarkable. No focal osseous lesions. No other marrow edema. Cord: Signal intensity within the cervical spinal cord is normal. Posterior Fossa, vertebral arteries, paraspinal tissues: Visualized portions of the brain and posterior fossa are normal. Craniocervical junction normal. Paraspinous soft tissues demonstrate no acute abnormality. No paraspinous or prevertebral edema. Normal intravascular flow voids present within the vertebral arteries bilaterally. Disc levels: C2-C3: Mild bilateral uncovertebral hypertrophy without significant stenosis. C3-C4: Shallow posterior disc bulge with minimal bilateral uncovertebral spurring. Right-sided facet arthrosis. Resultant mild right foraminal narrowing. Central canal and left neural foramen remain widely patent. C4-C5: Shallow central disc protrusion indents the ventral thecal sac without significant canal stenosis (series 7, image 14). Superimposed bilateral uncovertebral spurring and facet arthrosis. Resultant mild right foraminal stenosis. No significant left foraminal narrowing. C5-C6: Shallow posterior disc bulge with bilateral uncovertebral spurring.  Bulging disc mildly effaces the ventral thecal sac with resultant mild canal  stenosis. Minimal bilateral foraminal narrowing bilaterally. C6-C7: Prominent right foraminal disc extrusion extending into the right neural foramen (series 3, image 5). Slight cephalad angulation. Resultant severe right foraminal stenosis. Extruded disc potentially affects the right C7 nerve root. There is flattening of the right hemi cord without cord signal changes. Mild to moderate canal stenosis. Mild left foraminal narrowing related disc bulge and uncovertebral spurring as well. C7-T1:  Negative. Visualized upper thoracic spine demonstrates no acute abnormality. No significant stenosis. IMPRESSION: 1. Right foraminal disc extrusion at C6-7 with resultant severe right foraminal stenosis, potentially affecting the right C7 nerve root. Resultant mild to moderate canal narrowing with flattening of the right hemi cord without cord signal changes. 2. Additional more mild multilevel degenerative spondylolysis as detailed above. Electronically Signed   By: Rise Mu M.D.   On: 09/28/2015 05:17   Dg Chest Portable 1 View  09/27/2015  CLINICAL DATA:  Shortness of breath and chest pain EXAM: PORTABLE CHEST 1 VIEW COMPARISON:  None. FINDINGS: Lungs are clear. Heart size and pulmonary vascularity are normal. No adenopathy. No pneumothorax. No bone lesions. IMPRESSION: No edema or consolidation. Electronically Signed   By: Bretta Bang III M.D.   On: 09/27/2015 22:03   Ct Angio Chest/abd/pel For Dissection W And/or W/wo  09/27/2015  CLINICAL DATA:  Chest pain radiating into the back and right arm. Pain for 2 days. EXAM: CT ANGIOGRAPHY CHEST, ABDOMEN AND PELVIS TECHNIQUE: Multidetector CT imaging through the chest, abdomen and pelvis was performed using the standard protocol during bolus administration of intravenous contrast. Multiplanar reconstructed images and MIPs were obtained and reviewed to evaluate the vascular anatomy.  CONTRAST:  100 cc Isovue 370 IV COMPARISON:  Chest radiograph earlier this day FINDINGS: CTA CHEST FINDINGS Cardiovascular: No aortic dissection, aneurysm, hematoma or acute aortic injury. Conventional branching pattern from the aortic arch. Though not tailored for pulmonary embolus evaluation there are filling defects suspected in the segmental right lower lobe pulmonary arteries extending into subsegmental branches. Borderline straightening of the intraventricular septum and RV to LV ratio of 1. Mediastinum/nodes:  No adenopathy or pericardial fluid. Lungs: Mild motion artifact. No consolidation, pulmonary edema or pleural fluid. No pulmonary mass. Musculoskeletal: There are no acute or suspicious osseous abnormalities. Review of the MIP images confirms the above findings. CTA ABDOMEN AND PELVIS FINDINGS Liver: No focal lesion allowing for arterial phase imaging. Hepatobiliary: Gallbladder physiologically distended, no calcified stone. No biliary dilatation. Pancreas: No ductal dilatation or inflammation. Spleen: Normal. Adrenal glands: No nodule. Kidneys: Symmetric renal enhancement.  No hydronephrosis. Stomach/Bowel: Stomach distended with ingested contents. There are no dilated or thickened small bowel loops. Small volume of stool throughout the colon without colonic wall thickening. The appendix is normal. Vascular/Lymphatic: Normal abdominal aorta without dissection or aneurysm. No significant atherosclerosis. Patent celiac, superior mesenteric, and inferior mesenteric arteries. Patent single bilateral renal arteries. Normal caliber iliac arteries. No retroperitoneal adenopathy. Reproductive: No acute abnormality. Bladder: Physiologically distended without wall thickening. Other: No free air, free fluid, or intra-abdominal fluid collection. Surgical clips in the left inguinal region. Tiny fat containing umbilical hernia. Musculoskeletal: There are no acute or suspicious osseous abnormalities. Mild scoliotic  curvature of the lumbar spine and degenerative change. Review of the MIP images confirms the above findings. IMPRESSION: 1. Right lower lobe segmental and subsegmental pulmonary emboli. Borderline right heart strain, RV to LV ratio of 1. 2. Normal thoracoabdominal aorta.  No aortic dissection. Critical Value/emergent results were called by telephone at the time of interpretation on 09/27/2015  at 10:49 pm to Dr. Judd Lien, who verbally acknowledged these results. Electronically Signed   By: Rubye Oaks M.D.   On: 09/27/2015 22:49   Consultations:  Neurosurgery  PCCM  Antibiotics:  None  Discharge Exam: Filed Vitals:   09/29/15 0600 09/29/15 0800  BP: 138/95 140/98  Pulse:    Temp:  97.7 F (36.5 C)  Resp: 17 20   Filed Vitals:   09/29/15 0400 09/29/15 0424 09/29/15 0600 09/29/15 0800  BP: 133/85 133/85 138/95 140/98  Pulse:      Temp:  98.3 F (36.8 C)  97.7 F (36.5 C)  TempSrc:  Oral  Oral  Resp: 11  17 20   Height:      Weight:      SpO2: 94%  96% 94%    General: Pt is alert, follows commands appropriately, not in acute distress Cardiovascular: Regular rate and rhythm, S1/S2 +, no murmurs, no rubs, no gallops Respiratory: Clear to auscultation bilaterally, no wheezing, no crackles, no rhonchi Abdominal: Soft, non tender, non distended, bowel sounds +, no guarding Extremities: no edema, no cyanosis, pulses palpable bilaterally DP and PT Neuro: Grossly nonfocal  Discharge Instructions  Discharge Instructions    Call MD for:  severe uncontrolled pain    Complete by:  As directed      Call MD for:  temperature >100.4    Complete by:  As directed      Diet - low sodium heart healthy    Complete by:  As directed      Increase activity slowly    Complete by:  As directed             Medication List    STOP taking these medications        predniSONE 20 MG tablet  Commonly known as:  DELTASONE     traMADol 50 MG tablet  Commonly known as:  ULTRAM      TAKE  these medications        amphetamine-dextroamphetamine 20 MG tablet  Commonly known as:  ADDERALL  Take 20 mg by mouth 3 (three) times daily.     cyclobenzaprine 5 MG tablet  Commonly known as:  FLEXERIL  Take 1 tablet (5 mg total) by mouth 3 (three) times daily as needed for muscle spasms.     gabapentin 100 MG capsule  Commonly known as:  NEURONTIN  Take 3 capsules (300 mg total) by mouth 3 (three) times daily. Take 1 tablet at night time for 2 days. Then take 1 twice a day for 1 day then one 3 times a day.     methylPREDNISolone 4 MG Tbpk tablet  Commonly known as:  MEDROL DOSEPAK  Use as prescribed     oxyCODONE-acetaminophen 5-325 MG tablet  Commonly known as:  PERCOCET/ROXICET  Take 1-2 tablets by mouth every 3 (three) hours as needed for moderate pain.     Rivaroxaban 15 MG Tabs tablet  Commonly known as:  XARELTO  Take 1 tablet (15 mg total) by mouth 2 (two) times daily with a meal. Continue taking 15 mg tablet twice daily until August 9th, 2017.  Starting August 10th, 2017, continue taking Xarelto 20 mg table daily, separate script provided.     rivaroxaban 20 MG Tabs tablet  Commonly known as:  XARELTO  Take 1 tablet (20 mg total) by mouth daily with supper. Start taking August 10th, 2017  Start taking on:  10/20/2015  Follow-up Information    Schedule an appointment as soon as possible for a visit with [facility].   Specialty:  Internal Medicine   Contact information:   [address] [place] [address] [phone]      Call Debbora Presto, MD.   Specialty:  Internal Medicine   Why:  As needed   Contact information:   [address] [place] [address] [phone]        The results of significant diagnostics from this hospitalization (including imaging, microbiology, ancillary and laboratory) are listed below for reference.     Microbiology: Recent Results (from  the past 240 hour(s))  MRSA PCR Screening     Status: None   Collection Time: 09/28/15  1:44 AM  Result Value Ref Range Status   MRSA by PCR NEGATIVE NEGATIVE Final    Comment:        The GeneXpert MRSA Assay (FDA approved for NASAL specimens only), is one component of a comprehensive MRSA colonization surveillance program. It is not intended to diagnose MRSA infection nor to guide or monitor treatment for MRSA infections.      Labs: Basic Metabolic Panel:  Recent Labs Lab 09/27/15 2137 09/27/15 2204 09/28/15 0159 09/29/15 0242  NA 138 140 138 135  K 4.1 4.0 4.0 4.2  CL 103 103 104 104  CO2 23  --  24 25  GLUCOSE 115* 109* 111* 142*  BUN CREATININE 1.22 1.40* 1.32* 1.12  CALCIUM 9.9  --  8.6* 9.1   Liver Function Tests:  Recent Labs Lab 09/28/15 0159  AST 19  ALT 19  ALKPHOS 47  BILITOT 0.5  PROT 6.0*  ALBUMIN 3.9   No results for input(s): LIPASE, AMYLASE in the last 168 hours. No results for input(s): AMMONIA in the last 168 hours. CBC:  Recent Labs Lab 09/27/15 2137 09/27/15 2204 09/28/15 0159  WBC 9.5  --  7.4  HGB 15.3 16.0 12.9*  HCT 43.6 47.0 39.7  MCV 87.9  --  89.6  PLT 357  --  305   Cardiac Enzymes:  Recent Labs Lab 09/28/15 0159 09/28/15 0625 09/28/15 1157  TROPONINI <0.03 <0.03 <0.03   BNP: BNP (last 3 results)  Recent Labs  09/28/15 0625  BNP 6.5    ProBNP (last 3 results) No results for input(s): PROBNP in the last 8760 hours.  CBG: No results for input(s): GLUCAP in the last 168 hours.   SIGNED: Time coordinating discharge: 30 minutes  Debbora Presto, MD  [facility] 09/29/2015, 11:04 AM Pager [phone]  If 7PM-7AM, please contact night-coverage [email or web address] Password [email or web address]

## 2015-09-29 NOTE — Telephone Encounter (Signed)
Unfortunately I am not accepting new patients but other providers in our office are taking new patients.

## 2015-10-01 LAB — LUPUS ANTICOAGULANT PANEL
DRVVT: 64.3 s — ABNORMAL HIGH (ref 0.0–47.0)
PTT Lupus Anticoagulant: 33 s (ref 0.0–51.9)

## 2015-10-01 LAB — DRVVT MIX: dRVVT Mix: 52.8 s — ABNORMAL HIGH (ref 0.0–47.0)

## 2015-10-01 LAB — DRVVT CONFIRM: DRVVT CONFIRM: 1.4 ratio — AB (ref 0.8–1.2)

## 2015-10-12 ENCOUNTER — Telehealth: Payer: Self-pay | Admitting: General Practice

## 2015-10-12 NOTE — Telephone Encounter (Signed)
I spoke to the patient today. He is scheduled to establish care with [place] tomorrow. He states that his insurance company, who is an income based health plan out of [place], is wanting prior approval to cover him coming here. I called [employer], and they directed me to a prior auth form on their website. I filled it out and faxed it in to [phone]. They advised me that there is an approval process (much like approval for a drug), and they could not verify [facility] benedits.   I called the patient to advise that we would see him, but I could not guarantee benefits at this point. Additionally, I advised that he needs to attempt to get  Medicaid for his own benefit.   I will call the patient and advise once they notify me from the insurance company.

## 2015-10-13 ENCOUNTER — Ambulatory Visit: Payer: PRIVATE HEALTH INSURANCE | Admitting: Internal Medicine

## 2016-11-08 IMAGING — MR MR CERVICAL SPINE W/O CM
5 of 6 series · 18 of 48 positions shown · non-contrast
Comparison: None.

CLINICAL DATA: Initial evaluation for mid and upper back pain with
right arm numbness.

EXAM:
MRI CERVICAL SPINE WITHOUT CONTRAST
TECHNIQUE: Multiplanar, multisequence MR imaging of the cervical spine was
performed. No intravenous contrast was administered.

[Series 3: T2 · sagittal · 3.0mm · 0.43mm/px · 4 of 15 slices shown (1 of 2)]
[im 1/15]
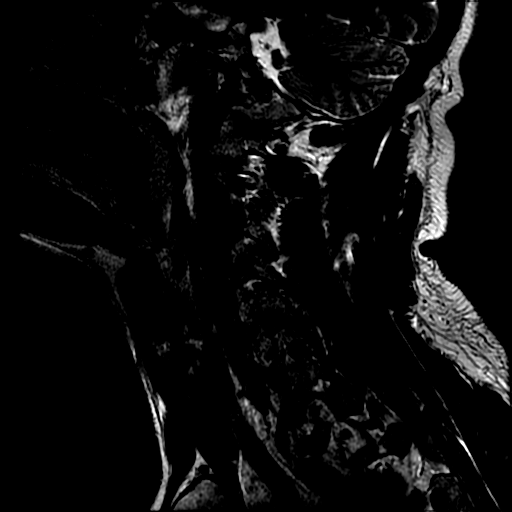
[im 5/15]
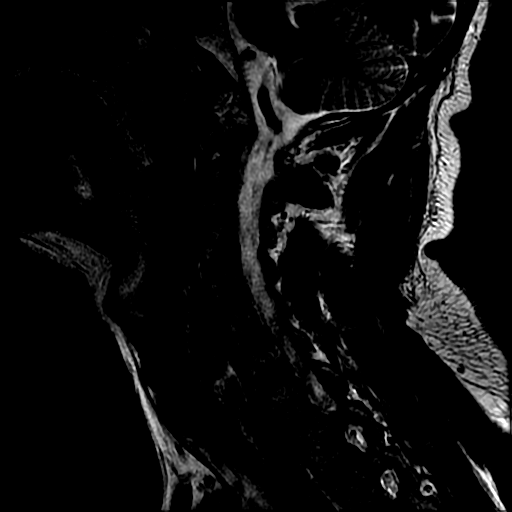
[im 10/15]
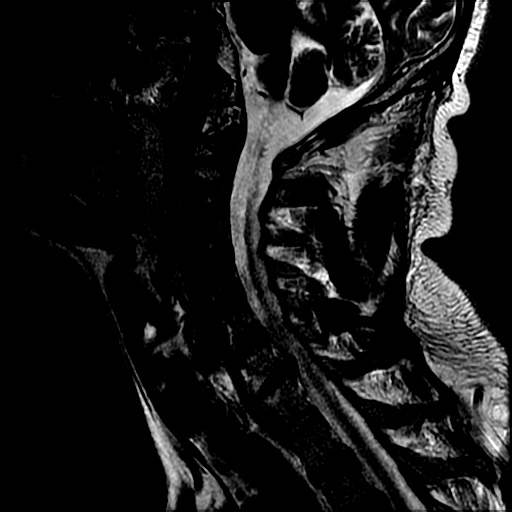
[im 15/15]
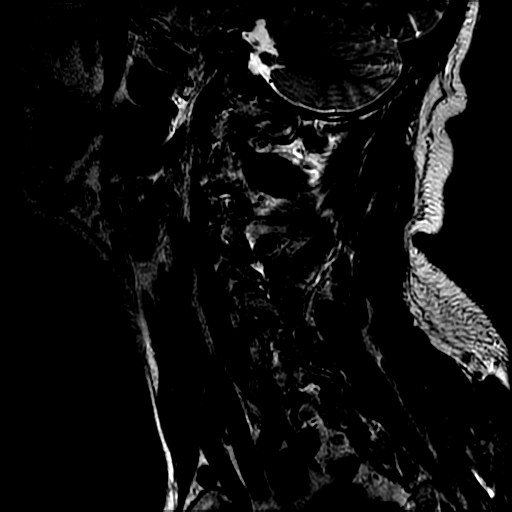

[Series 4: T1 · sagittal · 3.0mm · 0.43mm/px · 3 of 15 slices shown]
[im 1/15]
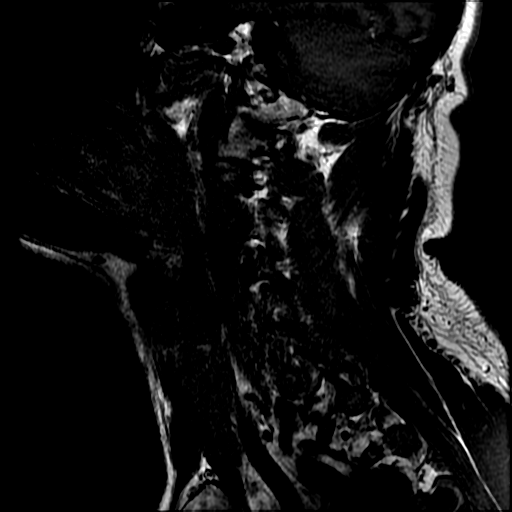
[im 8/15]
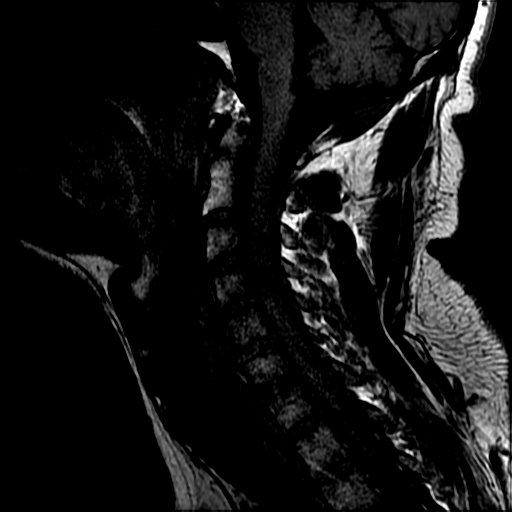
[im 15/15]
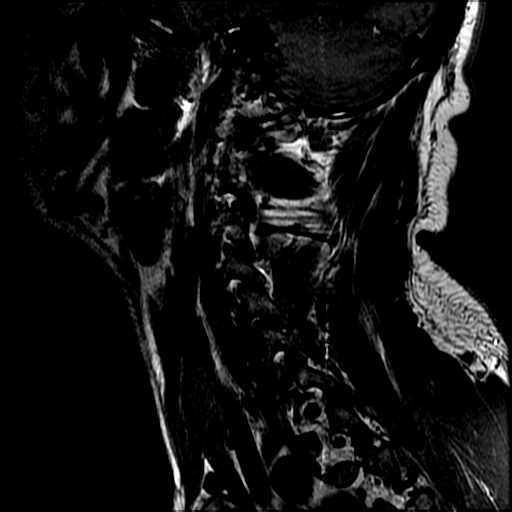

[Series 5: sag ir · sagittal · 3.0mm · 0.43mm/px · 3 of 15 slices shown]
[im 1/15]
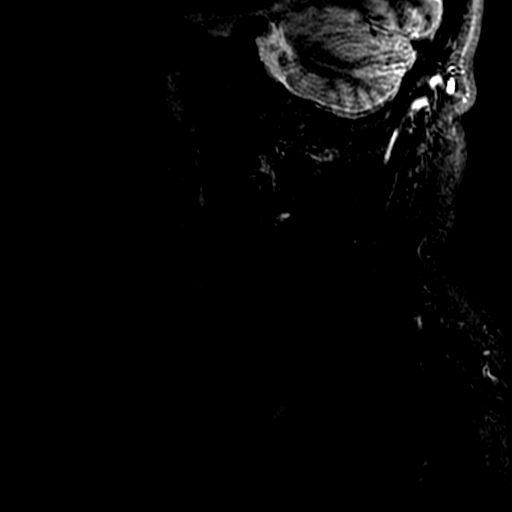
[im 8/15]
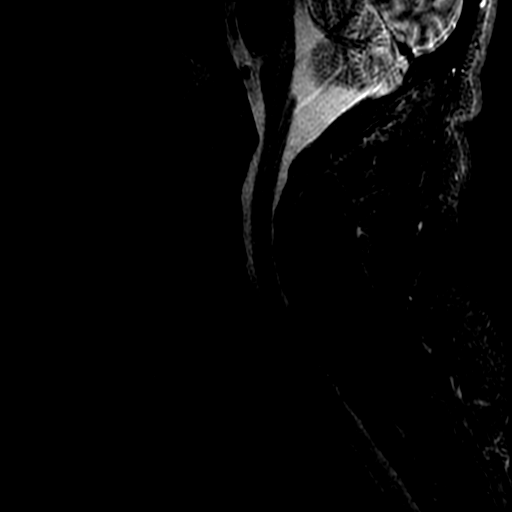
[im 15/15]
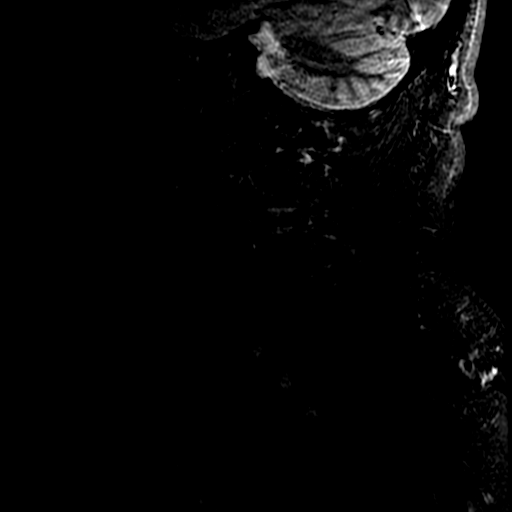

[Series 6: T2 · axial · 3.0mm · 0.39mm/px · z∈[-101,+4]mm · 7 of 32 slices shown (2 of 2)]
[im 1/32]
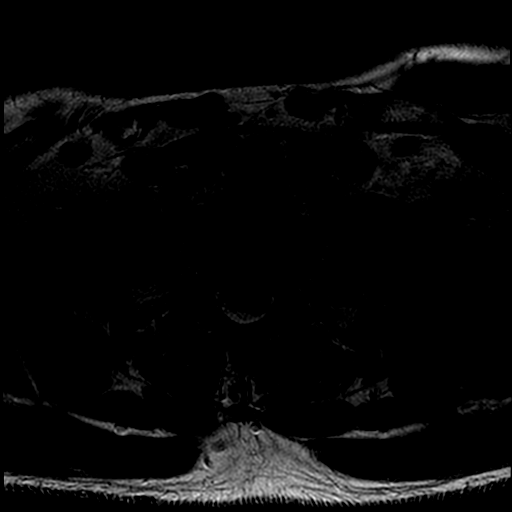
[im 6/32]
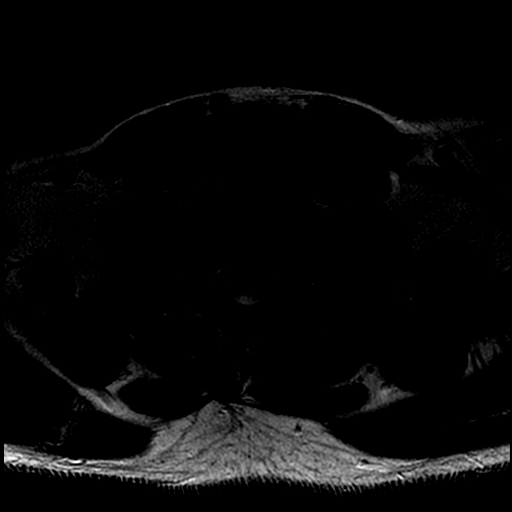
[im 11/32]
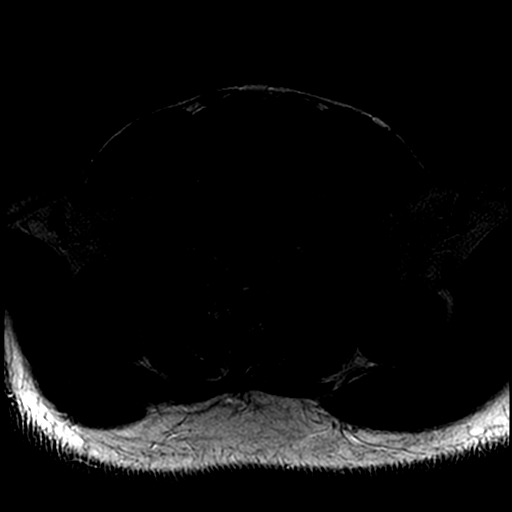
[im 16/32]
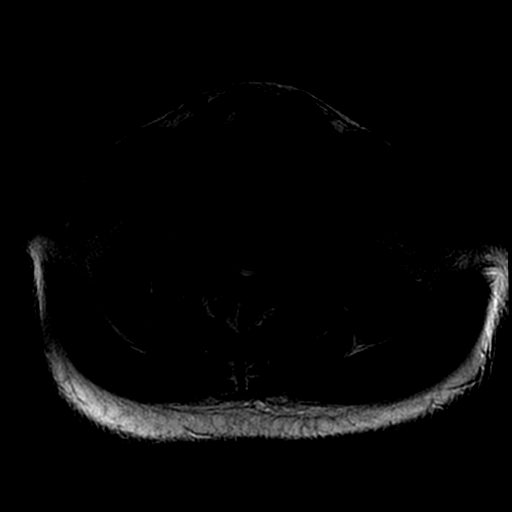
[im 21/32]
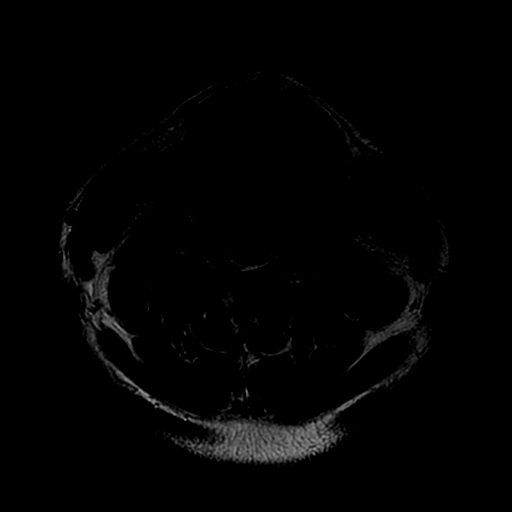
[im 26/32]
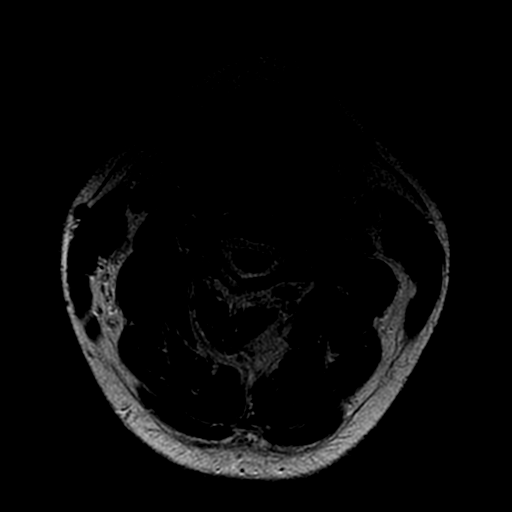
[im 32/32]
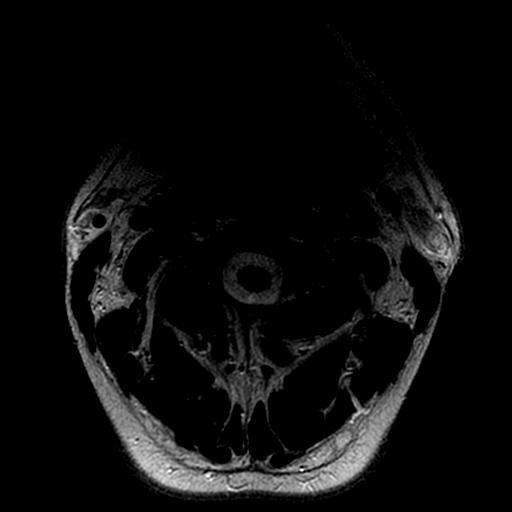

[Series 7: ax 2d merge · axial · 3.0mm · 0.39mm/px · 1 of 32 slices shown]
[im 1/32]
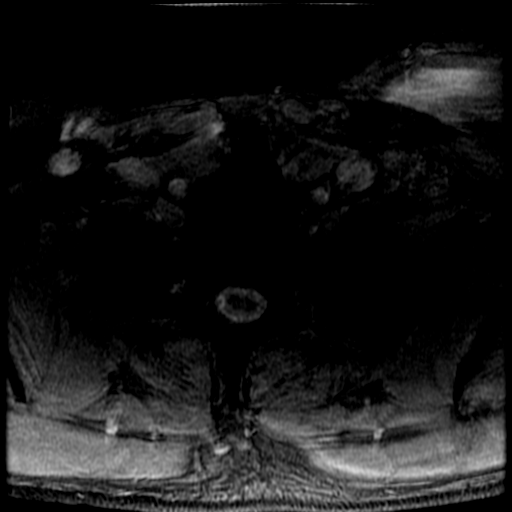

[18 of 48 positions shown; findings below may reference images not displayed]

FINDINGS: Alignment: Study degraded by motion artifact.

Vertebral bodies normally aligned with preservation of the normal
cervical lordosis. No listhesis or subluxation.

Vertebrae: Vertebral body heights preserved. No acute or chronic
fracture. Reactive endplate changes present about the C6-7
intervertebral disc space. Signal intensity within the vertebral
body bone marrow otherwise unremarkable. No focal osseous lesions.
No other marrow edema.

Cord: Signal intensity within the cervical spinal cord is normal.

Posterior Fossa, vertebral arteries, paraspinal tissues: Visualized
portions of the brain and posterior fossa are normal. Craniocervical
junction normal. Paraspinous soft tissues demonstrate no acute
abnormality. No paraspinous or prevertebral edema. Normal
intravascular flow voids present within the vertebral arteries
bilaterally.

Disc levels:

C2-C3: Mild bilateral uncovertebral hypertrophy without significant
stenosis.

C3-C4: Shallow posterior disc bulge with minimal bilateral
uncovertebral spurring. Right-sided facet arthrosis. Resultant mild
right foraminal narrowing. Central canal and left neural foramen
remain widely patent.

C4-C5: Shallow central disc protrusion indents the ventral thecal
sac without significant canal stenosis (series 7, image 14).
Superimposed bilateral uncovertebral spurring and facet arthrosis.
Resultant mild right foraminal stenosis. No significant left
foraminal narrowing.

C5-C6: Shallow posterior disc bulge with bilateral uncovertebral
spurring. Bulging disc mildly effaces the ventral thecal sac with
resultant mild canal stenosis. Minimal bilateral foraminal narrowing
bilaterally.

C6-C7: Prominent right foraminal disc extrusion extending into the
right neural foramen (series 3, image 5). Slight cephalad
angulation. Resultant severe right foraminal stenosis. Extruded disc
potentially affects the right C7 nerve root. There is flattening of
the right hemi cord without cord signal changes. Mild to moderate
canal stenosis. Mild left foraminal narrowing related disc bulge and
uncovertebral spurring as well.

C7-T1:  Negative.

Visualized upper thoracic spine demonstrates no acute abnormality.
No significant stenosis.
IMPRESSION: 1. Right foraminal disc extrusion at C6-7 with resultant severe
right foraminal stenosis, potentially affecting the right C7 nerve
root. Resultant mild to moderate canal narrowing with flattening of
the right hemi cord without cord signal changes.
2. Additional more mild multilevel degenerative spondylolysis as
detailed above.
